# Patient Record
Sex: Female | Born: 1945 | Race: White | Hispanic: No | Marital: Married | Smoking: Never smoker
Health system: Southern US, Community
[De-identification: ages and names within clinical notes are randomized; demographics above are authoritative.]

## PROBLEM LIST (undated history)

## (undated) DIAGNOSIS — I10 Essential (primary) hypertension: Secondary | ICD-10-CM

## (undated) DIAGNOSIS — R519 Headache, unspecified: Secondary | ICD-10-CM

## (undated) DIAGNOSIS — I252 Old myocardial infarction: Secondary | ICD-10-CM

## (undated) DIAGNOSIS — R51 Headache: Secondary | ICD-10-CM

## (undated) DIAGNOSIS — E785 Hyperlipidemia, unspecified: Secondary | ICD-10-CM

## (undated) DIAGNOSIS — I2510 Atherosclerotic heart disease of native coronary artery without angina pectoris: Secondary | ICD-10-CM

## (undated) HISTORY — DX: Headache: R51

## (undated) HISTORY — DX: Essential (primary) hypertension: I10

## (undated) HISTORY — DX: Atherosclerotic heart disease of native coronary artery without angina pectoris: I25.10

## (undated) HISTORY — DX: Old myocardial infarction: I25.2

## (undated) HISTORY — PX: AUGMENTATION MAMMAPLASTY: SUR837

## (undated) HISTORY — DX: Hyperlipidemia, unspecified: E78.5

## (undated) HISTORY — DX: Headache, unspecified: R51.9

## (undated) SURGERY — Surgical Case
Anesthesia: *Unknown

---

## 1979-09-07 HISTORY — PX: ABDOMINAL HYSTERECTOMY: SHX81

## 1983-09-07 HISTORY — PX: BILATERAL SALPINGOOPHORECTOMY: SHX1223

## 1998-07-07 ENCOUNTER — Ambulatory Visit (HOSPITAL_COMMUNITY): Admission: RE | Admit: 1998-07-07 | Discharge: 1998-07-07 | Payer: Self-pay | Admitting: Obstetrics and Gynecology

## 1998-07-07 ENCOUNTER — Encounter: Payer: Self-pay | Admitting: Obstetrics and Gynecology

## 1998-12-25 ENCOUNTER — Ambulatory Visit (HOSPITAL_COMMUNITY): Admission: RE | Admit: 1998-12-25 | Discharge: 1998-12-25 | Payer: Self-pay | Admitting: Family Medicine

## 1998-12-25 ENCOUNTER — Encounter: Payer: Self-pay | Admitting: Family Medicine

## 1999-07-06 ENCOUNTER — Encounter: Payer: Self-pay | Admitting: Obstetrics and Gynecology

## 1999-07-06 ENCOUNTER — Encounter: Admission: RE | Admit: 1999-07-06 | Discharge: 1999-07-06 | Payer: Self-pay | Admitting: Obstetrics and Gynecology

## 1999-07-13 ENCOUNTER — Encounter: Payer: Self-pay | Admitting: Obstetrics and Gynecology

## 1999-07-13 ENCOUNTER — Ambulatory Visit (HOSPITAL_COMMUNITY): Admission: RE | Admit: 1999-07-13 | Discharge: 1999-07-13 | Payer: Self-pay | Admitting: Obstetrics and Gynecology

## 2000-05-18 ENCOUNTER — Other Ambulatory Visit: Admission: RE | Admit: 2000-05-18 | Discharge: 2000-05-18 | Payer: Self-pay | Admitting: Obstetrics and Gynecology

## 2000-07-14 ENCOUNTER — Ambulatory Visit (HOSPITAL_COMMUNITY): Admission: RE | Admit: 2000-07-14 | Discharge: 2000-07-14 | Payer: Self-pay | Admitting: Family Medicine

## 2000-07-14 ENCOUNTER — Encounter: Payer: Self-pay | Admitting: Family Medicine

## 2001-07-19 ENCOUNTER — Ambulatory Visit (HOSPITAL_COMMUNITY): Admission: RE | Admit: 2001-07-19 | Discharge: 2001-07-19 | Payer: Self-pay | Admitting: Unknown Physician Specialty

## 2001-07-19 ENCOUNTER — Encounter: Payer: Self-pay | Admitting: Family Medicine

## 2002-03-14 ENCOUNTER — Emergency Department (HOSPITAL_COMMUNITY): Admission: EM | Admit: 2002-03-14 | Discharge: 2002-03-14 | Payer: Self-pay | Admitting: Emergency Medicine

## 2002-03-14 ENCOUNTER — Encounter: Payer: Self-pay | Admitting: Emergency Medicine

## 2002-08-15 ENCOUNTER — Ambulatory Visit (HOSPITAL_COMMUNITY): Admission: RE | Admit: 2002-08-15 | Discharge: 2002-08-15 | Payer: Self-pay | Admitting: Family Medicine

## 2002-08-15 ENCOUNTER — Encounter: Payer: Self-pay | Admitting: Family Medicine

## 2002-10-16 ENCOUNTER — Other Ambulatory Visit: Admission: RE | Admit: 2002-10-16 | Discharge: 2002-10-16 | Payer: Self-pay | Admitting: Gynecology

## 2003-08-19 ENCOUNTER — Ambulatory Visit (HOSPITAL_COMMUNITY): Admission: RE | Admit: 2003-08-19 | Discharge: 2003-08-19 | Payer: Self-pay | Admitting: Family Medicine

## 2003-08-27 ENCOUNTER — Encounter: Admission: RE | Admit: 2003-08-27 | Discharge: 2003-08-27 | Payer: Self-pay | Admitting: Family Medicine

## 2003-10-14 ENCOUNTER — Other Ambulatory Visit: Admission: RE | Admit: 2003-10-14 | Discharge: 2003-10-14 | Payer: Self-pay | Admitting: Gynecology

## 2004-09-10 ENCOUNTER — Encounter: Admission: RE | Admit: 2004-09-10 | Discharge: 2004-09-10 | Payer: Self-pay | Admitting: Gynecology

## 2004-10-14 ENCOUNTER — Other Ambulatory Visit: Admission: RE | Admit: 2004-10-14 | Discharge: 2004-10-14 | Payer: Self-pay | Admitting: Gynecology

## 2005-04-13 ENCOUNTER — Other Ambulatory Visit: Admission: RE | Admit: 2005-04-13 | Discharge: 2005-04-13 | Payer: Self-pay | Admitting: Gynecology

## 2005-09-27 ENCOUNTER — Encounter: Admission: RE | Admit: 2005-09-27 | Discharge: 2005-09-27 | Payer: Self-pay | Admitting: Gynecology

## 2005-11-01 ENCOUNTER — Other Ambulatory Visit: Admission: RE | Admit: 2005-11-01 | Discharge: 2005-11-01 | Payer: Self-pay | Admitting: Gynecology

## 2006-05-16 ENCOUNTER — Other Ambulatory Visit: Admission: RE | Admit: 2006-05-16 | Discharge: 2006-05-16 | Payer: Self-pay | Admitting: Gynecology

## 2006-08-12 ENCOUNTER — Inpatient Hospital Stay (HOSPITAL_COMMUNITY): Admission: EM | Admit: 2006-08-12 | Discharge: 2006-08-14 | Payer: Self-pay | Admitting: Emergency Medicine

## 2006-08-12 HISTORY — PX: CARDIAC CATHETERIZATION: SHX172

## 2006-10-25 ENCOUNTER — Encounter: Admission: RE | Admit: 2006-10-25 | Discharge: 2006-10-25 | Payer: Self-pay | Admitting: Gynecology

## 2007-02-28 ENCOUNTER — Other Ambulatory Visit: Admission: RE | Admit: 2007-02-28 | Discharge: 2007-02-28 | Payer: Self-pay | Admitting: Gynecology

## 2007-12-19 ENCOUNTER — Inpatient Hospital Stay (HOSPITAL_COMMUNITY): Admission: EM | Admit: 2007-12-19 | Discharge: 2008-01-05 | Payer: Self-pay | Admitting: Emergency Medicine

## 2008-01-22 ENCOUNTER — Encounter: Admission: RE | Admit: 2008-01-22 | Discharge: 2008-01-22 | Payer: Self-pay | Admitting: Family Medicine

## 2008-01-22 ENCOUNTER — Encounter: Admission: RE | Admit: 2008-01-22 | Discharge: 2008-01-22 | Payer: Self-pay | Admitting: Gastroenterology

## 2008-02-07 ENCOUNTER — Encounter: Admission: RE | Admit: 2008-02-07 | Discharge: 2008-02-07 | Payer: Self-pay | Admitting: Gastroenterology

## 2008-03-26 ENCOUNTER — Encounter: Admission: RE | Admit: 2008-03-26 | Discharge: 2008-03-26 | Payer: Self-pay | Admitting: Family Medicine

## 2008-11-19 IMAGING — CT CT ABDOMEN W/ CM
2 of 5 series · 13 of 32 positions shown, 18 images · IV contrast (READICAT/WATER & [ID] OMNI 300)
Comparison: 01/01/2008

CT ABDOMEN

CLINICAL DATA: Pancreatitis

CT ABDOMEN AND PELVIS WITH CONTRAST
TECHNIQUE: Multidetector CT imaging of the abdomen and pelvis was
performed using the standard protocol following bolus
administration of intravenous contrast.
Contrast: 100 ml [id number]

[Series 2: abdomen w/ · axial · 0.70mm/px · z∈[-311,+14]mm · 6 of 91 slices shown, 11 images]
[im 13/91  soft-tissue]
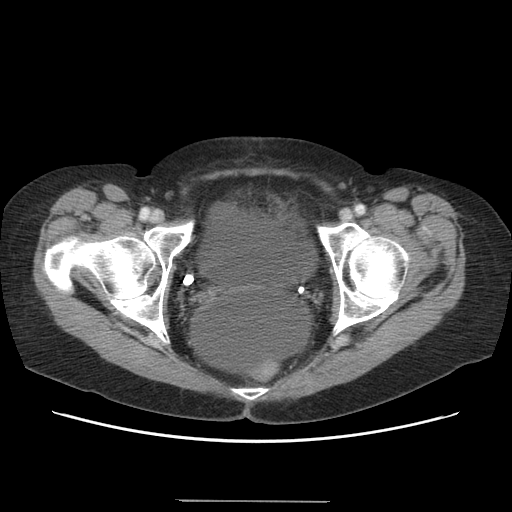
[im 13/91  bone]
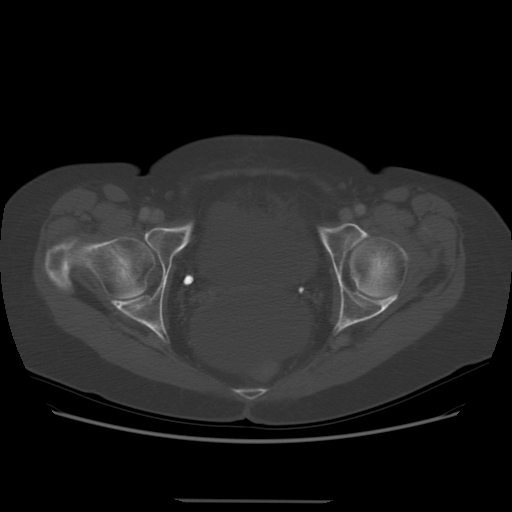
[im 26/91  soft-tissue]
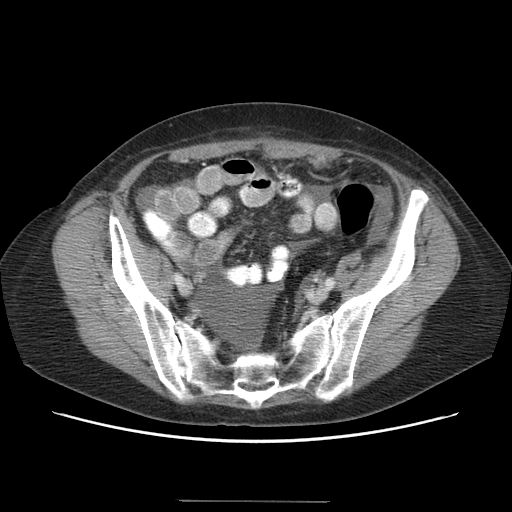
[im 39/91  soft-tissue]
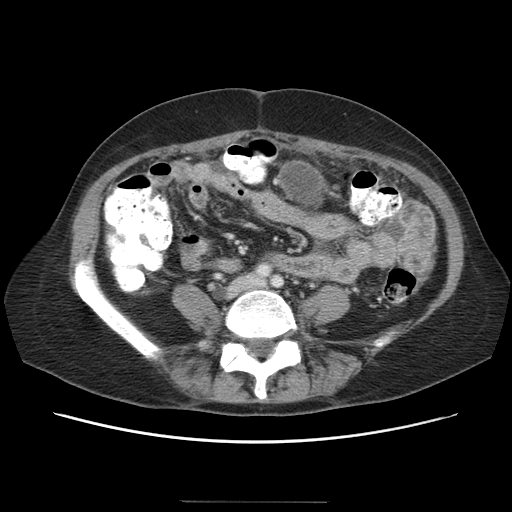
[im 39/91  lung]
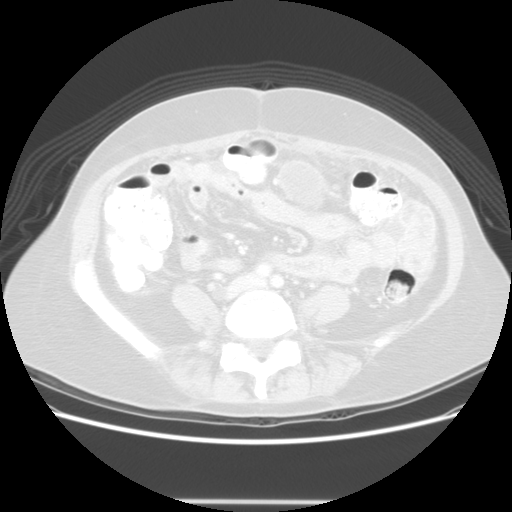
[im 52/91  soft-tissue]
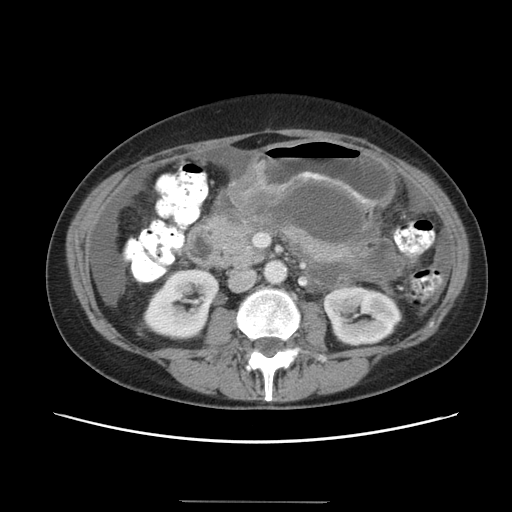
[im 52/91  lung]
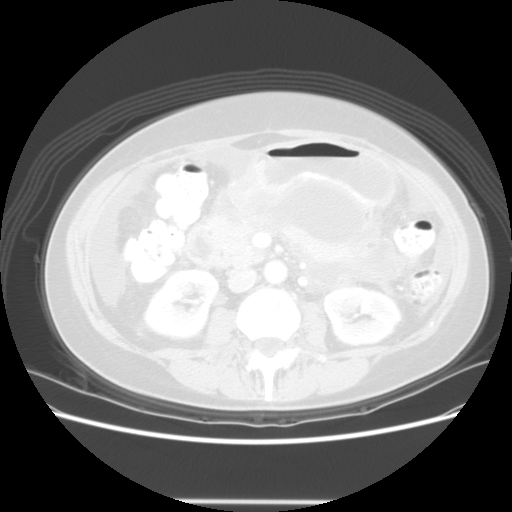
[im 65/91  soft-tissue]
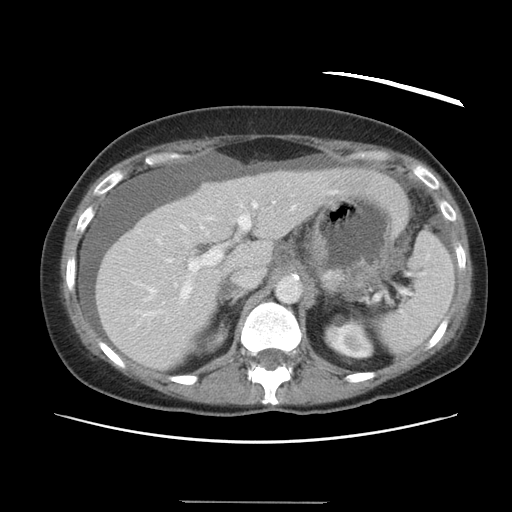
[im 65/91  lung]
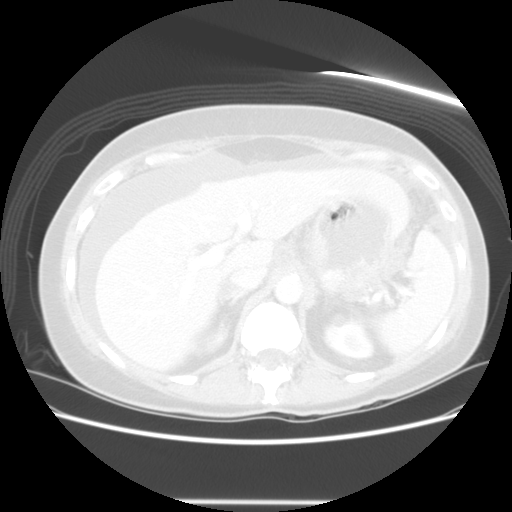
[im 78/91  soft-tissue]
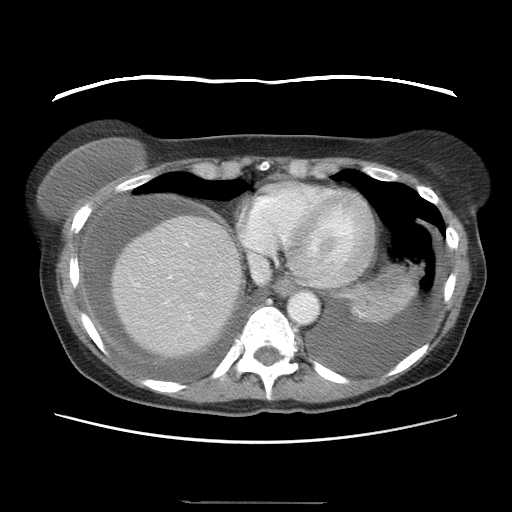
[im 78/91  lung]
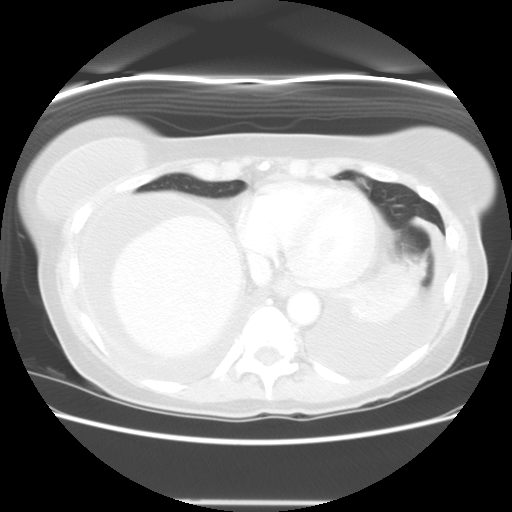

[Series 401: sagittal · sagittal · 0.94mm/px · 7 of 132 slices shown]
[im 14/132  soft-tissue]
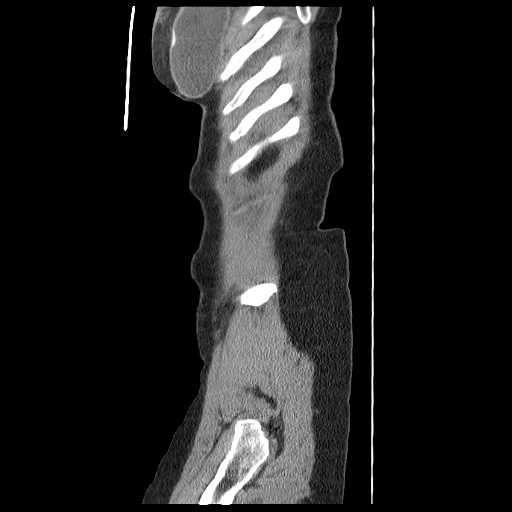
[im 27/132  soft-tissue]
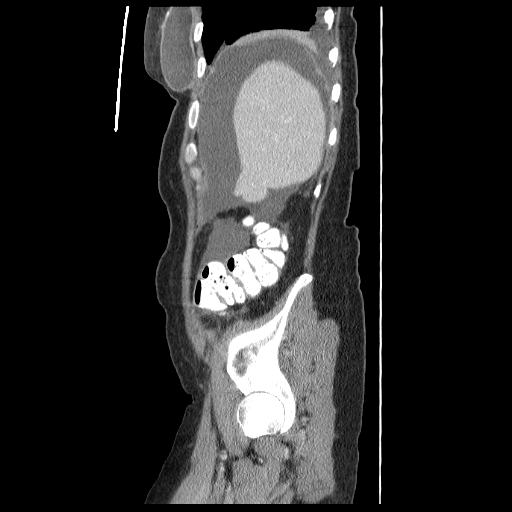
[im 40/132  soft-tissue]
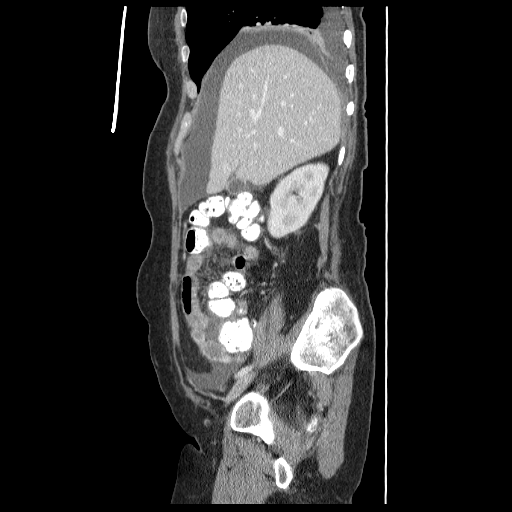
[im 53/132  soft-tissue]
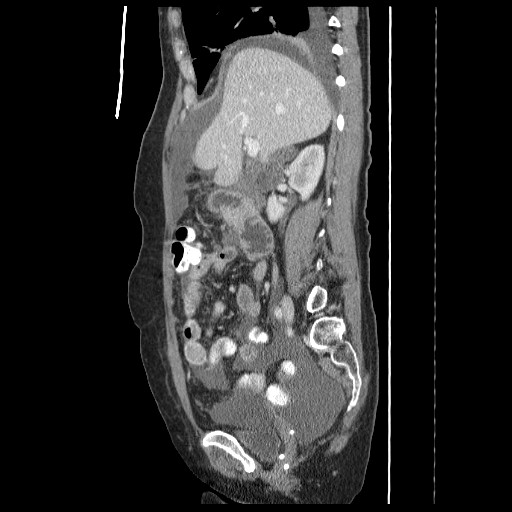
[im 79/132  soft-tissue]
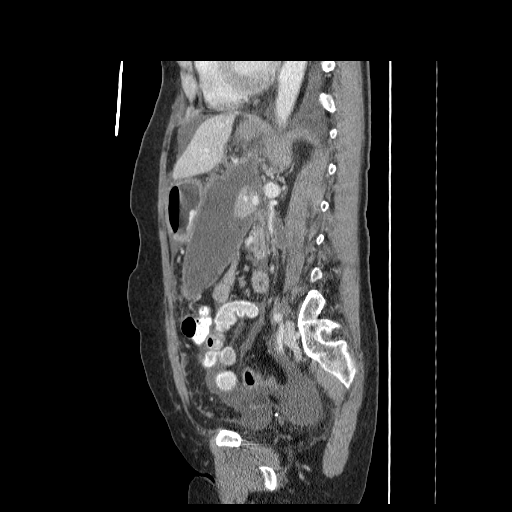
[im 92/132  soft-tissue]
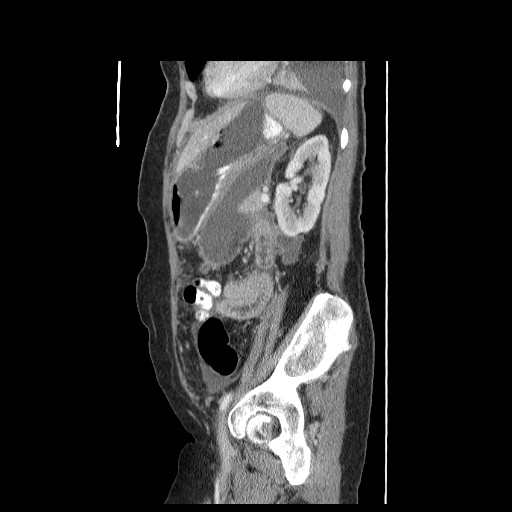
[im 105/132  soft-tissue]
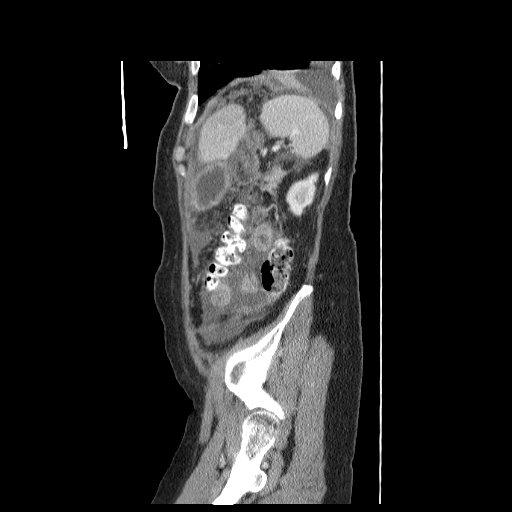

[13 of 32 positions shown; findings below may reference images not displayed]

FINDINGS: Lung bases show small bilateral pleural effusions.
There has been slight interval decrease in the amount of abdominal
free fluid although there is persistent perihepatic and perisplenic
ascites.  No focal abnormality is seen in the liver or spleen.  The
stomach is not distended.  Fluid within the lesser sac and
posterior to the distal stomach has become more organized in the
interval.  This collection measures 10.0 by 4.9 x 7.3 cm.  The
duodenum, gallbladder, adrenal glands, and kidneys are
unremarkable.

Numerous small fluid collections are scattered in the
retroperitoneal tissues, in the region of the hepatic duodenal
ligament and around the pancreatic tail.  Some of these demonstrate
rim enhancement suggesting interval evolution/Organization.  Most
of these collections are in the 2-4 cm size. The pancreas enhances
throughout.  The degree of pancreatic and retroperitoneal edema
appears decreased in the interval.  There is no pancreatic ductal
dilatation.

No evidence for bowel obstruction.  The portal vein, splenic vein,
and superior mesenteric vein are patent although there is mass
effect on the mid aspect of the splenic vein secondary to a
retroperitoneal fluid collection.
IMPRESSION: There has been minimal interval decrease in the free-flowing
intraperitoneal fluid.  A fluid collection in the lesser sac has
become more organized in the interval and suggests the presence of
an evolving pseudocyst. This generates mass effect on the posterior
stomach.

Multiple other 2-4 cm fluid collections in the retroperitoneal
tissues are becoming more organized and may represent scattered
small evolving pseudocysts.

No vascular complication at this time.

CT PELVIS
FINDINGS: There is persistent moderate volume intraperitoneal free
fluid and interloop mesenteric fluid.  No evidence for bowel
obstruction.  The uterus is surgically absent.  No adnexal mass is
identified.  The appendix and the terminal ileum are unremarkable.
IMPRESSION: The previous study did not include evaluation of the pelvis.
However when comparing to a study from 12/20/2007, the
intraperitoneal fluid in the pelvis has increased.

## 2009-01-14 ENCOUNTER — Encounter: Admission: RE | Admit: 2009-01-14 | Discharge: 2009-01-14 | Payer: Self-pay | Admitting: Family Medicine

## 2009-04-25 ENCOUNTER — Encounter: Admission: RE | Admit: 2009-04-25 | Discharge: 2009-04-25 | Payer: Self-pay | Admitting: Gynecology

## 2009-04-30 ENCOUNTER — Encounter: Admission: RE | Admit: 2009-04-30 | Discharge: 2009-04-30 | Payer: Self-pay | Admitting: Gynecology

## 2010-05-26 ENCOUNTER — Encounter: Admission: RE | Admit: 2010-05-26 | Discharge: 2010-05-26 | Payer: Self-pay | Admitting: Gynecology

## 2010-09-27 ENCOUNTER — Encounter: Payer: Self-pay | Admitting: Gynecology

## 2010-09-28 ENCOUNTER — Encounter: Payer: Self-pay | Admitting: Gynecology

## 2011-01-19 NOTE — H&P (Signed)
NAMETIAWANA, FORGY                 ACCOUNT NO.:  [id number]   MEDICAL RECORD NO.:  [id number]          PATIENT TYPE:  INP   LOCATION:  1825                         FACILITY:  [facility]   PHYSICIAN:  Michiel Cowboy, MDDATE OF BIRTH:  1945-12-07   DATE OF ADMISSION:  12/19/2007  DATE OF DISCHARGE:                              HISTORY & PHYSICAL   PRIMARY CARE Ivery Michalski:  Holley Bouche, M.D.   The patient is a 65 year old female with a past medical history of  coronary artery disease, hypertension and hyperlipidemia.  She presented  to her primary care Abegail Kloeppel because of severe epigastric pain this a.m.  The patient went this morning to exercise in the gym, and then ate  lunch.  Shortly thereafter the patient developed severe nausea,  vomiting, diarrhea and severe epigastric pain.  At first the patient  presented to her primary care Buell Parcel, who felt that she was too sick  and sent her down to the emergency department by EMS.  In the ER a right  upper quadrant ultrasound was done, that was suggestive of pancreatitis.  Her lipase was drawn and was above 2000.  [facility] called to  admit the patient.   ALLERGIES:  PLAVIX.   MEDICATIONS:  1. Aspirin 81 mg p.o. daily.  2. Celexa 20 mg p.o. daily.  3. Hydrochlorothiazide 6.25 mg p.o. daily.  4. Lipitor 20 mg p.o. daily.  5. Toprol XL 50 mg p.o. daily.   PAST MEDICAL HISTORY:  1. Myocardial infarction.  2. Hypertension.  3. Hyperlipidemia.   SOCIAL HISTORY:  The patient does not drink absolutely.  She has not had  any alcohol for a very long period of time, cannot recall her last  drink.  Denies any smoking.  Has a very healthy lifestyle, this is as  per her husband.   FAMILY HISTORY:  Noncontributory.  No liver or pancreatic disease.   VITALS:  Temperature 97.0, blood pressure 163/90, pulse 67, respirations  20, saturations 93% on room air.   PHYSICAL EXAMINATION:  The patient is a white female clearly in pain,  sitting on a stretcher hugging her abdomen.  Somewhat dry mucous  membranes.  Decreased skin turgor.  HEENT:  Head nontraumatic.  LUNGS:  Clear to auscultation bilaterally.  HEART:  Regular rate and rhythm; no murmurs, rubs or gallops.  ABDOMEN:  Severe epigastric tenderness in the right upper quadrant and  left upper quadrant tenderness.  Guarding noted.  EXTREMITIES:  Lower extremities without edema.  NEUROLOGIC:  Intact.   LABS:  White blood cell count 14.8, hemoglobin 14.1, platelets 227.  Sodium 137, potassium 3.3, BUN 17, creatinine 1.1.  Total bilirubin 1.8,  alkaline phosphatase 166.  AST 447, ALT 244.  Calcium 8.3.  Lipase above  2000.   EKG:  Showing normal sinus at rate of 64.  No evidence of ischemia or  infarction.   STUDIES:  CHEST X-RAY:  No acute chest disease noted.  RIGHT UPPER  QUADRANT ULTRASOUND:  Showed mild fullness in heterogenicity of  pancreas; that raised a question of pancreatitis.  Small amount of  abdominal  ascites.  No evidence of cholelithiasis or biliary ductal  dilatation.  Gallbladder wall was minimally thickened, which can be seen  in setting of ascites.  Cortical thinning of both kidneys, suggests  medical renal disease.   ASSESSMENT AND PLAN:  This is a 65 year old female with acute  pancreatitis.  Etiology unclear.  1. Pancreatitis:  We will admit to stepdown, given the severity.  Will      make the patient n.p.o.  IV hydration.  The patient already      received Zosyn in the ED.  At this point, no evidence of      necrotizing pancreatitis.  Will hold off on antibiotics for now.      Will attempt to get hold of GI for further recommendation.  The      patient may not need either MRI or CT; will go directly to ERCP,      pending if further evidence of stone obstruction.  Currently, the      ultrasound did not show any dilatation of the biliary duct.  Other      etiologies for pancreatitis; the patient denies alcohol.  Will      check  triglyceride level.  Will check hepatitis serologies;      although much less likely.  Will also check heme ABV serologies,      although again very much less likely.  Will hold      hydrochlorothiazide.  2. Coronary artery disease:  Continue aspirin and restart the Toprol      tomorrow if holding parameters.  3. History of depression:  Continue Celexa.  4. Pain care management:  Will do Dilaudid PCA.  Do Colace and Senokot      for prophylaxis of likely constipation.  Also, given the degree of      abdominal pain, will obtain just plain film x-ray to rule out the      possibility of free air; as any other etiology of severe abdominal      pain.  5. Hypopotassemia, will replace.  6. Prophylaxis:  SCDs and Protonix.      Michiel Cowboy, MD  Electronically Signed     AVD/MEDQ  D:  12/20/2007  T:  12/20/2007  Job:  [id number]   cc:   Holley Bouche, M.D.

## 2011-01-19 NOTE — Discharge Summary (Signed)
NAMEJEHIELI, BRASSELL                 ACCOUNT NO.:  [id number]   MEDICAL RECORD NO.:  [id number]          PATIENT TYPE:  INP   LOCATION:  5157                         FACILITY:  [facility]   PHYSICIAN:  Kela Millin, M.D.DATE OF BIRTH:  1946/01/30   DATE OF ADMISSION:  12/19/2007  DATE OF DISCHARGE:  01/02/2008                               DISCHARGE SUMMARY   DISCHARGE DIAGNOSES:  1. Acute pancreatitis, possibly medication induced.  The patient was      on hydrochlorothiazide and also lisinopril which are both possible      causes of pancreatitis.  Workup negative for gallstones, and the      patient denied alcohol abuse.  GI (Dr. Ewing Schlein) recommended      outpatient follow-up once she is over this episode for      cholecystectomy and doing intraoperative cholangiogram (even though      studies as above negative for gallstones), due to the degree of      elevated LFTs and lipase upon admission.  2. Possible hospital-acquired pneumonia, right upper lobe.  3. Peripheral edema/pleural effusions.  Secondary to hypoalbuminemia.      Improved with diuresis.  4. Hypokalemia.  Potassium replaced.  5. Hypertension.  6. Abnormal LFTs.  Improved, as discussed above.  7. History of coronary artery disease.  Stable.  8. Hyperlipidemia.  9. Normocytic anemia.   PROCEDURES AND STUDIES:  1. Abdominal ultrasound.  Mild fullness and heterogeneity of the      pancreas racing possibility of pancreatitis per radiology.  Small      amount of abdominal ascites likely secondary to pancreatitis.  No      evidence of cholelithiasis or biliary ductal dilatation.      Gallbladder wall minimally thickened which can be seen in the      setting of ascites.  Cortical thinning of both kidneys suggest      medical renal disease.  2. CT scan of abdomen and pelvis on December 20, 2007.  Findings      consistent with acute pancreatitis with abdominal ascites with no      evidence of pancreatic necrosis or pseudocyst  formation at this      time.  Flat inferior vena cava suggest hypovolemia.  3. MRCP on December 26, 2007.  Stable changes of acute pancreatitis with      retroperitoneal edema, ascites and flank edema.  No evidence of      pancreatic necrosis or pseudocyst formation.  No biliary dilatation      or evidence of choledocholithiasis.  4. CT scan of he abdomen on January 01, 2008.  Mild improvement in no      acute pancreatitis involving the pancreatic head uncinate process.      Increased generalized peripancreatic and intraperitoneal fluid.  No      mature pseudocyst identified.  New moderate bilateral pleural      effusions and basilar atelectasis.  5. Chest x-ray on December 27, 2007.  Bilateral pleural effusions, also      bibasilar atelectatic/infiltrate changes and mild patchy      atelectasis/infiltrates  within the right upper lobe.   CONSULTATIONS:  Gastroenterology, Deboraha Sprang.   BRIEF HISTORY:  The patient is a pleasant 65 year old white female with  the above-listed medical problems who presented with complaints of  severe epigastric pain on the morning of admission.  She reported that  she had gone to the gym to exercise that morning and then ate lunch, and  shortly thereafter she developed severe nausea, vomiting and diarrhea  with severe epigastric pain.  She was sent to the ER for further  evaluation and a right upper quadrant ultrasound was done which was  suggestive of pancreatitis.  Her lipase was greater than 2000, and she  was admitted for further evaluation and management.   Please see the full admission history and physical dictated by Dr.  Adela Glimpse for the details of the admission physical exam as well as the  laboratory data.   HOSPITAL COURSE:  1. Acute pancreatitis, severe.  Upon admission, the patient was kept      n.p.o. and started on IV fluids for hydration as well as IV      narcotics for pain management.  Workup for possible etiologies      included the abdominal  ultrasound, MRCPs and CT scans evaluating      for possible gallstones and these were negative for gallstones.      Triglyceride level was checked and on admission and it was 53 and      the patient denied alcohol abuse.  It was noted that she was on      hydrochlorothiazide which is a known cause of pancreatitis and      lisinopril which can also possibly cause pancreatitis, and these      were both held during her hospital stay.  Gastroenterology was      consulted and followed and assisted in the patient's management      throughout her stay in the hospital.  It was noted that her LFTs      were elevated on admission, total bilirubin of 1.8, alkaline      phosphatase of 166, SGOT 447, SGPT of 244.  This improved while she      was in the hospital to a total bilirubin of 1.2, alkaline      phosphatase of 191, SGOT of 72, SGPT of 48, albumin still low at      1.5 as of the last recheck on December 29, 2007, and GI indicated that      because of this marked LFT elevation on admission as well as the      marked lipase elevations that once the patient is over this episode      to follow up outpatient for cholecystectomy with intraoperative      cholangiogram, even though no gallstones were seen in the above      studies as already discussed.  The patient was slow to improve as      even after her abdominal pain improved.  She had difficulty      tolerating adequate p.o. intake, and so she was subsequently      started on tube feedings at the end of the period that this      dictation stands, and this was after she had had repeat imaging      studies that showed that there was mild improvement in the      pancreatitis.  Follow-up lipase on April 22 was 26 and the amylase      was  at 90.  The patient was hemodynamically stable and afebrile.  2. Probable hospital-acquired pneumonia, right upper lobe.  The      patient had progressive shortness of breath and her white cell      count was worsening  even though repeat imaging studies had shown      there was mild improvement in the pancreatitis, a chest x-ray was      done for further evaluation and compared to the previous chest x-      ray it showed that there was mild increase in bilateral pleural      effusion as well as bibasilar atelectatic/infiltrative changes with      a small patchy area of airspace disease noted within the right      upper lobe.  The patient was then started on this Zosyn for      possible hospital-acquired pneumonia and was also diuresed with IV      Lasix.  With this intervention, her shortness of breath improved      and also she has remained afebrile with a gradual improvement in      leukocytosis.  3. Pleural effusions/peripheral edema.  The impression was that this      was secondary to hypoalbuminemia (her last albumin was 1.5), and      the patient had been requiring IV fluids for hydration.  As soon as      she was able to tolerate some p.o.'s, the IV fluids were decreased      and she was diuresed as appropriate.  Once she was started on tube      feedings, the IV fluids were discontinued.  The edema as well as      the pleural effusions and shortness of breath have improved with      diuresis.  4. Normocytic anemia, likely due to chronic disease.  The patient was      transfused per GI, and her hemoglobin improved from 8.1-10.5 and      has remained stable.  She did not have any evidence of GI bleed      during her hospital stay.  5. Abnormal LFTs.  As discussed above, she is to follow up outpatient.  6. Hypokalemia.  Potassium was replaced while in the hospital.  7. Coronary artery disease.  Stable, she was maintained on her Toprol      during her hospital stay.  8. Hypertension.  As discussed above, her lisinopril and HCTZ were      held during her hospital stay, and appropriate medications to be      indicated upon discharge per discharging physician.     A discharge addendum is to be  done by the discharging physician  including the discharge medications and follow-up care per the  discharging hospitalist.      Kela Millin, M.D.  Electronically Signed     ACV/MEDQ  D:  01/05/2008  T:  01/05/2008  Job:  [id number]   cc:   Holley Bouche, M.D.  James L. Malon Kindle., M.D.

## 2011-01-22 NOTE — Discharge Summary (Signed)
[name], [name]                 ACCOUNT NO.:  [id number]   MEDICAL RECORD NO.:  [id number]          PATIENT TYPE:  INP   LOCATION:  2009                         FACILITY:  [facility]   PHYSICIAN:  Vesta Mixer, M.D. DATE OF BIRTH:  Feb 02, 1946   DATE OF ADMISSION:  08/12/2006  DATE OF DISCHARGE:  08/14/2006                               DISCHARGE SUMMARY   DISCHARGE DIAGNOSES:  1. Acute inferoapical myocardial infarction.  2. Hyperlipidemia.  3. Hypertension.   DISCHARGE MEDICATIONS:  1. Enteric-coated aspirin 81 mg a day.  2. Plavix 75 mg a day.  3. Lipitor 20 mg a day.  4. Toprol XL 50 mg a day.  5. Lisinopril 10 mg tablets - one half tablet twice a day.  6. Nitroglycerin 0.4 mg sublingually as needed.  7. Celexa 20 mg a day.   DISPOSITION:  The patient will see Dr. Elease Hashimoto in the office in 1-2  weeks.  She is to call for questions.   HISTORY:  [name] is a 65 year old female with a history of  hypertension and hypercholesterolemia.  She was admitted with acute  headache and chest pain.  Please see dictated H&P for further details.   HOSPITAL COURSE BY PROBLEM:  CHEST PAIN.  The patient developed severe  headache and severe chest pain on the afternoon of admission.  She  presented to the ER and was found to be quite hypertensive.  Her blood  pressure came down nicely.  She was found to have ST-segment elevation  in the inferolateral leads.  By the time she arrived in the ER she was  not having any further episodes of chest pain.  Her troponin levels were  found to be elevated and the second troponin level was higher still.  We  took her to the cath lab and she was found to have a subtotal occlusion  of the distal aspect of her posterior descending artery.  The posterior  descending artery improved during the duration of the heart  catheterization most likely because of the Integrilin and heparin.  By  the end of the case, she did have some flow down the posterior  descending artery.  This vessel is too small to consider angioplasty or  stenting.   The left ventriculogram revealed overall well-preserved left ventricular  systolic function but she did have an inferoapical wall motion  abnormality consistent with her infarction.   She is started on lisinopril and her Toprol was continued.  She had  Integrilin for 18 hours and gradually improved.  She did not have any  further episodes of chest discomfort.   She is discharged on the above-noted medications and disposition.  Our  hope is that this inferoapical wall motion defect will improve over time  with adequate medical therapy.   In the office, we will make sure that her cholesterol levels are  adequate with an LDL between 70 and 80.  We will continue with the ACE  inhibitor.   She is to call for any problems or questions.           ______________________________  Vesta Mixer, M.D.     PJN/MEDQ  D:  08/14/2006  T:  08/14/2006  Job:  [id number]   cc:   Holley Bouche, M.D.

## 2011-01-22 NOTE — Discharge Summary (Signed)
NAMETOLULOPE, PINKETT                 ACCOUNT NO.:  [id number]   MEDICAL RECORD NO.:  [id number]          PATIENT TYPE:  INP   LOCATION:  5157                         FACILITY:  [facility]   PHYSICIAN:  Corinna L. Lendell Caprice, MDDATE OF BIRTH:  November 09, 1945   DATE OF ADMISSION:  12/19/2007  DATE OF DISCHARGE:  01/05/2008                               DISCHARGE SUMMARY   ADDENDUM  The patient's diet was able to be advanced.  Her tube feeds were able to  be stopped.  Antibiotics were stopped.  She was instructed to stop  hydrochlorothiazide, Lipitor, lisinopril and increase her Toprol-XL to  100 mg a day.  She could resume aspirin 81 mg a day, Celexa 20 mg a day,  and calcium with vitamin D daily, albuterol twice a day  2 puffs q.4 h.  p.r.n. for wheeze.  Condition stable.  Follow up with Dr. Carman Ching  on Jan 19, 2008, at 9:45.  Follow up with Dr. Tiburcio Pea on Jan 11, 2008, at  11:30.  Please monitor blood pressure.  Activity ad lib.  Condition  stable.  Diet should be low-fat.      Corinna L. Lendell Caprice, MD  Electronically Signed     CLS/MEDQ  D:  03/06/2008  T:  03/07/2008  Job:  [id number]   cc:   Fayrene Fearing L. Malon Kindle., M.D.  Holley Bouche, M.D.

## 2011-01-22 NOTE — Cardiovascular Report (Signed)
[name], [name] NO.:  [id number]   MEDICAL RECORD NO.:  [id number]          PATIENT TYPE:  INP   LOCATION:  2807                         FACILITY:  [facility]   PHYSICIAN:  Vesta Mixer, M.D. DATE OF BIRTH:  [birth year]   DATE OF PROCEDURE:  08/12/2006  DATE OF DISCHARGE:                            CARDIAC CATHETERIZATION   [name] is a 65 year old female with a history of hypertension and  hypercholesterolemia.  She was brought to the cath lab emergently after  having some episodes of chest discomfort and an abnormal troponin level  as well as an abnormal EKG.   PROCEDURE:  Left heart catheterization with coronary angiography.   The right femoral artery was easily cannulated using a modified  Seldinger technique.   HEMODYNAMIC RESULTS:  LV pressure is 117/90 with an aortic pressure of  115/65.   ANGIOGRAPHY:  The left main is smooth and normal.   The left anterior descending artery is smooth and normal.  It gives off  two large diagonal branches both of which are normal.   The left circumflex artery is a fairly large vessel.  It gives off two  moderate sized obtuse marginal branches, both of which are normal.  The  circumflex artery terminates as a posterolateral branch which is also  normal.   The right coronary artery is large and dominant.  It is smooth and  normal throughout its course.  It gives off a moderate size posterior  descending artery.  The distal aspect of the posterior descending artery  is subtotally occluded with TIMI grade 1 flow.  While we were doing the  injections, the flow seemed to be gradually improving.   The posterior descending artery is unremarkable.   The left ventriculogram was performed in the 30 RAO position.  It  revealed overall normal left ventricular systolic function.  There was  akinesis of the inferior apical wall consistent with the subtotal  occlusion of the posterior descending artery.   COMPLICATIONS:  None.   CONCLUSION:  1. Single-vessel coronary artery disease involving the distal aspect      of the posterior descending artery.  This branch is very tiny and      is not a candidate for percutaneous intervention.  In fact, it      seems to be getting better as      the patient is on heparin and Integrilin.  We will continue with      medical therapy and we will treat her with Plavix as an outpatient.      She does have a segmental wall motion abnormality but I would      expect some of this to improve with medical therapy.  All our other      coronary arteries are unremarkable.           ______________________________  Vesta Mixer, M.D.     PJN/MEDQ  D:  08/12/2006  T:  08/13/2006  Job:  [id number]   cc:   Holley Bouche, M.D.  Armanda Magic, M.D.

## 2011-01-22 NOTE — H&P (Signed)
[name], [name] NO.:  [id number]   MEDICAL RECORD NO.:  [id number]          PATIENT TYPE:  EMS   LOCATION:  [facility]                         FACILITY:  [facility]   PHYSICIAN:  Vesta Mixer, M.D. DATE OF BIRTH:  [birth year]   DATE OF ADMISSION:  08/12/2006  DATE OF DISCHARGE:                              HISTORY & PHYSICAL   HISTORY OF PRESENT ILLNESS:  [name] is a 65 year old female with a  history of hypercholesterolemia and hypertension.  She is admitted with  acute chest pain, as well as a headache.   The patient has been in her normal state of health.  This afternoon she  developed a severe headache and went to see her medical doctor.  She was  noted to have some hypertension.  I am not sure of her blood pressure at  that time.  She was told to come to the hospital.  Along the way she  developed the worst headache of her life.  She also developed some mild  chest discomfort.  She pulled off the road and let her husband drive her  the rest of the way to the hospital.  Here in the emergency room she had  an emergent CT scan of her head which was negative for an acute bleed.  She mentioned some mild chest pressure and a subsequent EKG revealed ST-  segment elevation in the inferolateral leads.  Her subsequent troponin  levels were elevated and she is now being taken to the cath lab for  emergent evaluation.   The patient is completely pain-free at this time.  She denies any  headache or chest discomfort.  She denies any real shortness of breath.  She has never had any previous episodes of chest discomfort.  She is  relatively healthy and is relatively active.   CURRENT MEDICATIONS:  Lipitor 20 mg a day, Toprol XL 75 mg a day and  Celexa 20 mg a day.   ALLERGIES:  NONE.   PAST MEDICAL HISTORY:  1. Hypercholesterolemia  2. Hypertension.   SOCIAL HISTORY:  The patient is a nonsmoker.  She works as a Futures trader.   FAMILY HISTORY:  Unremarkable.   PHYSICAL EXAMINATION:  GENERAL:  On exam she is a middle-aged female in  no acute distress.  She is alert and oriented times three.  Mood and  affect are normal.  VITAL SIGNS:  Blood pressure is 157/95 with a heart rate of 64.  HEENT:  Exam reveals 2+ carotids.  She has no bruits, no JVD, no  thyromegaly.  LUNGS:  Clear.  HEART:  Regular rate, S1-S2.  I did not hear any pericardial friction  rub.  ABDOMEN:  Exam is benign.  EXTREMITIES:  She has no clubbing, cyanosis or edema.  Pulses are  intact.  There is no calf tenderness.  There are no palpable cords.   DATA REVIEWED:  Her EKG reveals normal sinus rhythm/sinus bradycardia.  She has persistent ST-segment elevation in the inferolateral leads.  She  had this ST-segment elevation while she was having some mild chest  tightness, and  she has the persistent ST-segment elevation even while  pain-free.   Troponin levels initially were 0.31 and now are 0.6.   ASSESSMENT AND PLAN:  [name] presents with an acute episode of  severe headache accompanied by some chest pressure.  She has an EKG  consistent with ST-segment elevation myocardial infarction and troponin  levels that are slowly rising.  We will take her urgently to the cath  lab for further evaluation.  We discussed the risks, benefits and  options of heart catheterization.  She understands and agrees to  proceed.  All of her other medical problems remain stable.           ______________________________  Vesta Mixer, M.D.     PJN/MEDQ  D:  08/12/2006  T:  08/13/2006  Job:  [id number]   cc:   Holley Bouche, M.D.

## 2011-02-22 ENCOUNTER — Other Ambulatory Visit: Payer: Self-pay | Admitting: Family Medicine

## 2011-02-22 ENCOUNTER — Ambulatory Visit
Admission: RE | Admit: 2011-02-22 | Discharge: 2011-02-22 | Disposition: A | Discharge status: Home / Self Care | Payer: Medicare Other | Source: Ambulatory Visit | Attending: Family Medicine | Admitting: Family Medicine

## 2011-05-20 ENCOUNTER — Other Ambulatory Visit: Payer: Self-pay | Admitting: Gynecology

## 2011-05-20 DIAGNOSIS — Z1231 Encounter for screening mammogram for malignant neoplasm of breast: Secondary | ICD-10-CM

## 2011-06-01 LAB — BASIC METABOLIC PANEL
BUN: 10
BUN: 18
BUN: 25 — ABNORMAL HIGH
BUN: 3 — ABNORMAL LOW
BUN: 7
BUN: 7
BUN: 8
BUN: 9
CO2: 16 — ABNORMAL LOW
CO2: 19
CO2: 22
CO2: 22
CO2: 23
CO2: 27
CO2: 29
CO2: 33 — ABNORMAL HIGH
Calcium: 5 — CL
Calcium: 5.5 — CL
Calcium: 6.6 — ABNORMAL LOW
Calcium: 6.7 — ABNORMAL LOW
Calcium: 7 — ABNORMAL LOW
Calcium: 7 — ABNORMAL LOW
Calcium: 7.1 — ABNORMAL LOW
Calcium: 7.5 — ABNORMAL LOW
Chloride: 104
Chloride: 105
Chloride: 105
Chloride: 109
Chloride: 114 — ABNORMAL HIGH
Chloride: 91 — ABNORMAL LOW
Chloride: 98
Chloride: 98
Creatinine, Ser: 0.88
Creatinine, Ser: 0.89
Creatinine, Ser: 0.96
Creatinine, Ser: 1.04
Creatinine, Ser: 1.08
Creatinine, Ser: 1.14
Creatinine, Ser: 1.24 — ABNORMAL HIGH
Creatinine, Ser: 1.44 — ABNORMAL HIGH
GFR calc Af Amer: 44 — ABNORMAL LOW
GFR calc Af Amer: 53 — ABNORMAL LOW
GFR calc Af Amer: 58 — ABNORMAL LOW
GFR calc Af Amer: >60
GFR calc Af Amer: >60
GFR calc Af Amer: >60
GFR calc Af Amer: >60
GFR calc Af Amer: >60
GFR calc non Af Amer: 37 — ABNORMAL LOW
GFR calc non Af Amer: 44 — ABNORMAL LOW
GFR calc non Af Amer: 48 — ABNORMAL LOW
GFR calc non Af Amer: 51 — ABNORMAL LOW
GFR calc non Af Amer: 54 — ABNORMAL LOW
GFR calc non Af Amer: 59 — ABNORMAL LOW
GFR calc non Af Amer: >60
GFR calc non Af Amer: >60
Glucose, Bld: 104 — ABNORMAL HIGH
Glucose, Bld: 107 — ABNORMAL HIGH
Glucose, Bld: 117 — ABNORMAL HIGH
Glucose, Bld: 122 — ABNORMAL HIGH
Glucose, Bld: 129 — ABNORMAL HIGH
Glucose, Bld: 156 — ABNORMAL HIGH
Glucose, Bld: 183 — ABNORMAL HIGH
Glucose, Bld: 85
Potassium: 2.8 — ABNORMAL LOW
Potassium: 3.1 — ABNORMAL LOW
Potassium: 3.5
Potassium: 3.8
Potassium: 3.9
Potassium: 4
Potassium: 4.1
Potassium: 5.9 — ABNORMAL HIGH
Sodium: 131 — ABNORMAL LOW
Sodium: 132 — ABNORMAL LOW
Sodium: 133 — ABNORMAL LOW
Sodium: 134 — ABNORMAL LOW
Sodium: 134 — ABNORMAL LOW
Sodium: 135
Sodium: 135
Sodium: 138

## 2011-06-01 LAB — COMPREHENSIVE METABOLIC PANEL
ALT: 244 — ABNORMAL HIGH
ALT: 102 — ABNORMAL HIGH
ALT: 266 — ABNORMAL HIGH
ALT: 34
ALT: 38 — ABNORMAL HIGH
ALT: 40 — ABNORMAL HIGH
ALT: 41 — ABNORMAL HIGH
ALT: 41 — ABNORMAL HIGH
ALT: 43 — ABNORMAL HIGH
ALT: 45 — ABNORMAL HIGH
ALT: 47 — ABNORMAL HIGH
ALT: 48 — ABNORMAL HIGH
AST: 447 — ABNORMAL HIGH
AST: 105 — ABNORMAL HIGH
AST: 314 — ABNORMAL HIGH
AST: 42 — ABNORMAL HIGH
AST: 46 — ABNORMAL HIGH
AST: 48 — ABNORMAL HIGH
AST: 49 — ABNORMAL HIGH
AST: 50 — ABNORMAL HIGH
AST: 54 — ABNORMAL HIGH
AST: 59 — ABNORMAL HIGH
AST: 62 — ABNORMAL HIGH
AST: 72 — ABNORMAL HIGH
Albumin: 3.7
Albumin: 1.5 — ABNORMAL LOW
Albumin: 1.5 — ABNORMAL LOW
Albumin: 1.5 — ABNORMAL LOW
Albumin: 1.5 — ABNORMAL LOW
Albumin: 1.6 — ABNORMAL LOW
Albumin: 1.6 — ABNORMAL LOW
Albumin: 1.7 — ABNORMAL LOW
Albumin: 1.8 — ABNORMAL LOW
Albumin: 1.9 — ABNORMAL LOW
Albumin: 2.1 — ABNORMAL LOW
Albumin: 3.4 — ABNORMAL LOW
Alkaline Phosphatase: 166 — ABNORMAL HIGH
Alkaline Phosphatase: 105
Alkaline Phosphatase: 128 — ABNORMAL HIGH
Alkaline Phosphatase: 153 — ABNORMAL HIGH
Alkaline Phosphatase: 161 — ABNORMAL HIGH
Alkaline Phosphatase: 176 — ABNORMAL HIGH
Alkaline Phosphatase: 191 — ABNORMAL HIGH
Alkaline Phosphatase: 57
Alkaline Phosphatase: 61
Alkaline Phosphatase: 78
Alkaline Phosphatase: 80
Alkaline Phosphatase: 85
BUN: 17
BUN: 12
BUN: 17
BUN: 20
BUN: 23
BUN: 23
BUN: 4 — ABNORMAL LOW
BUN: 6
BUN: 8
BUN: 8
BUN: 8
BUN: 9
CO2: 23
CO2: 14 — ABNORMAL LOW
CO2: 18 — ABNORMAL LOW
CO2: 18 — ABNORMAL LOW
CO2: 20
CO2: 21
CO2: 22
CO2: 23
CO2: 24
CO2: 31
CO2: 32
CO2: 33 — ABNORMAL HIGH
Calcium: 8.3 — ABNORMAL LOW
Calcium: 5 — CL
Calcium: 5.4 — CL
Calcium: 6.7 — ABNORMAL LOW
Calcium: 6.7 — ABNORMAL LOW
Calcium: 7 — ABNORMAL LOW
Calcium: 7.1 — ABNORMAL LOW
Calcium: 7.1 — ABNORMAL LOW
Calcium: 7.2 — ABNORMAL LOW
Calcium: 7.2 — ABNORMAL LOW
Calcium: 7.2 — ABNORMAL LOW
Calcium: 7.3 — ABNORMAL LOW
Chloride: 100
Chloride: 102
Chloride: 106
Chloride: 107
Chloride: 107
Chloride: 107
Chloride: 109
Chloride: 113 — ABNORMAL HIGH
Chloride: 116 — ABNORMAL HIGH
Chloride: 89 — ABNORMAL LOW
Chloride: 92 — ABNORMAL LOW
Chloride: 94 — ABNORMAL LOW
Creatinine, Ser: 1.1
Creatinine, Ser: 0.88
Creatinine, Ser: 1.01
Creatinine, Ser: 1.03
Creatinine, Ser: 1.03
Creatinine, Ser: 1.1
Creatinine, Ser: 1.2
Creatinine, Ser: 1.22 — ABNORMAL HIGH
Creatinine, Ser: 1.25 — ABNORMAL HIGH
Creatinine, Ser: 1.29 — ABNORMAL HIGH
Creatinine, Ser: 1.29 — ABNORMAL HIGH
Creatinine, Ser: 1.36 — ABNORMAL HIGH
GFR calc Af Amer: >60
GFR calc Af Amer: 48 — ABNORMAL LOW
GFR calc Af Amer: 51 — ABNORMAL LOW
GFR calc Af Amer: 51 — ABNORMAL LOW
GFR calc Af Amer: 52 — ABNORMAL LOW
GFR calc Af Amer: 54 — ABNORMAL LOW
GFR calc Af Amer: 55 — ABNORMAL LOW
GFR calc Af Amer: >60
GFR calc Af Amer: >60
GFR calc Af Amer: >60
GFR calc Af Amer: >60
GFR calc Af Amer: >60
GFR calc non Af Amer: 50 — ABNORMAL LOW
GFR calc non Af Amer: 39 — ABNORMAL LOW
GFR calc non Af Amer: 42 — ABNORMAL LOW
GFR calc non Af Amer: 42 — ABNORMAL LOW
GFR calc non Af Amer: 43 — ABNORMAL LOW
GFR calc non Af Amer: 45 — ABNORMAL LOW
GFR calc non Af Amer: 45 — ABNORMAL LOW
GFR calc non Af Amer: 50 — ABNORMAL LOW
GFR calc non Af Amer: 54 — ABNORMAL LOW
GFR calc non Af Amer: 54 — ABNORMAL LOW
GFR calc non Af Amer: 55 — ABNORMAL LOW
GFR calc non Af Amer: >60
Glucose, Bld: 216 — ABNORMAL HIGH
Glucose, Bld: 120 — ABNORMAL HIGH
Glucose, Bld: 127 — ABNORMAL HIGH
Glucose, Bld: 136 — ABNORMAL HIGH
Glucose, Bld: 142 — ABNORMAL HIGH
Glucose, Bld: 145 — ABNORMAL HIGH
Glucose, Bld: 171 — ABNORMAL HIGH
Glucose, Bld: 171 — ABNORMAL HIGH
Glucose, Bld: 178 — ABNORMAL HIGH
Glucose, Bld: 60 — ABNORMAL LOW
Glucose, Bld: 90
Glucose, Bld: 95
Potassium: 3.3 — ABNORMAL LOW
Potassium: 2.6 — CL
Potassium: 2.8 — ABNORMAL LOW
Potassium: 2.9 — ABNORMAL LOW
Potassium: 3 — ABNORMAL LOW
Potassium: 3.1 — ABNORMAL LOW
Potassium: 3.4 — ABNORMAL LOW
Potassium: 3.4 — ABNORMAL LOW
Potassium: 4.3
Potassium: 4.3
Potassium: 4.6
Potassium: 5.4 — ABNORMAL HIGH
Sodium: 137
Sodium: 133 — ABNORMAL LOW
Sodium: 133 — ABNORMAL LOW
Sodium: 133 — ABNORMAL LOW
Sodium: 135
Sodium: 136
Sodium: 137
Sodium: 137
Sodium: 137
Sodium: 138
Sodium: 138
Sodium: 140
Total Bilirubin: 1.8 — ABNORMAL HIGH
Total Bilirubin: 0.5
Total Bilirubin: 0.6
Total Bilirubin: 0.7
Total Bilirubin: 0.8
Total Bilirubin: 0.8
Total Bilirubin: 0.9
Total Bilirubin: 0.9
Total Bilirubin: 1
Total Bilirubin: 1.1
Total Bilirubin: 1.2
Total Bilirubin: 1.5 — ABNORMAL HIGH
Total Protein: 6.3
Total Protein: 3.9 — ABNORMAL LOW
Total Protein: 3.9 — ABNORMAL LOW
Total Protein: 4.3 — ABNORMAL LOW
Total Protein: 4.4 — ABNORMAL LOW
Total Protein: 4.6 — ABNORMAL LOW
Total Protein: 4.7 — ABNORMAL LOW
Total Protein: 4.8 — ABNORMAL LOW
Total Protein: 4.9 — ABNORMAL LOW
Total Protein: 4.9 — ABNORMAL LOW
Total Protein: 5.3 — ABNORMAL LOW
Total Protein: 5.8 — ABNORMAL LOW

## 2011-06-01 LAB — ABO/RH: ABO/RH(D): O POS

## 2011-06-01 LAB — MAGNESIUM
Magnesium: 1.3 — ABNORMAL LOW
Magnesium: 1.3 — ABNORMAL LOW
Magnesium: 1.6
Magnesium: 1.6
Magnesium: 1.8
Magnesium: 2

## 2011-06-01 LAB — URINE MICROSCOPIC-ADD ON

## 2011-06-01 LAB — CBC
HCT: 41.2
HCT: 23.2 — ABNORMAL LOW
HCT: 23.3 — ABNORMAL LOW
HCT: 23.4 — ABNORMAL LOW
HCT: 24 — ABNORMAL LOW
HCT: 24.9 — ABNORMAL LOW
HCT: 25.4 — ABNORMAL LOW
HCT: 26.2 — ABNORMAL LOW
HCT: 27 — ABNORMAL LOW
HCT: 27.1 — ABNORMAL LOW
HCT: 28.3 — ABNORMAL LOW
HCT: 28.6 — ABNORMAL LOW
HCT: 28.6 — ABNORMAL LOW
HCT: 30.7 — ABNORMAL LOW
HCT: 34.5 — ABNORMAL LOW
HCT: 40.4
Hemoglobin: 14.1
Hemoglobin: 10 — ABNORMAL LOW
Hemoglobin: 10.5 — ABNORMAL LOW
Hemoglobin: 11.8 — ABNORMAL LOW
Hemoglobin: 13.7
Hemoglobin: 8.1 — ABNORMAL LOW
Hemoglobin: 8.1 — ABNORMAL LOW
Hemoglobin: 8.1 — ABNORMAL LOW
Hemoglobin: 8.2 — ABNORMAL LOW
Hemoglobin: 8.3 — ABNORMAL LOW
Hemoglobin: 8.8 — ABNORMAL LOW
Hemoglobin: 9.3 — ABNORMAL LOW
Hemoglobin: 9.3 — ABNORMAL LOW
Hemoglobin: 9.5 — ABNORMAL LOW
Hemoglobin: 9.9 — ABNORMAL LOW
Hemoglobin: 9.9 — ABNORMAL LOW
MCHC: 34.1
MCHC: 33.4
MCHC: 33.9
MCHC: 34
MCHC: 34.2
MCHC: 34.2
MCHC: 34.3
MCHC: 34.5
MCHC: 34.7
MCHC: 34.7
MCHC: 34.8
MCHC: 35.1
MCHC: 35.1
MCHC: 35.1
MCHC: 35.1
MCHC: 35.4
MCV: 93.8
MCV: 91.1
MCV: 92.8
MCV: 93.5
MCV: 93.6
MCV: 93.9
MCV: 94.1
MCV: 94.1
MCV: 94.1
MCV: 94.4
MCV: 94.5
MCV: 94.5
MCV: 94.5
MCV: 94.8
MCV: 95.1
MCV: 95.2
Platelets: 227
Platelets: 162
Platelets: 200
Platelets: 214
Platelets: 256
Platelets: 273
Platelets: 284
Platelets: 290
Platelets: 293
Platelets: 317
Platelets: 330
Platelets: 331
Platelets: 365
Platelets: 422 — ABNORMAL HIGH
Platelets: 465 — ABNORMAL HIGH
Platelets: 543 — ABNORMAL HIGH
RBC: 4.39
RBC: 2.48 — ABNORMAL LOW
RBC: 2.48 — ABNORMAL LOW
RBC: 2.51 — ABNORMAL LOW
RBC: 2.55 — ABNORMAL LOW
RBC: 2.64 — ABNORMAL LOW
RBC: 2.73 — ABNORMAL LOW
RBC: 2.76 — ABNORMAL LOW
RBC: 2.86 — ABNORMAL LOW
RBC: 2.87 — ABNORMAL LOW
RBC: 2.97 — ABNORMAL LOW
RBC: 3.04 — ABNORMAL LOW
RBC: 3.05 — ABNORMAL LOW
RBC: 3.37 — ABNORMAL LOW
RBC: 3.64 — ABNORMAL LOW
RBC: 4.28
RDW: 12.4
RDW: 12.9
RDW: 13
RDW: 13.4
RDW: 13.4
RDW: 13.6
RDW: 13.7
RDW: 13.7
RDW: 13.7
RDW: 13.8
RDW: 13.8
RDW: 13.8
RDW: 14
RDW: 14
RDW: 14.2
RDW: 14.9
WBC: 14.8 — ABNORMAL HIGH
WBC: 10.4
WBC: 11.3 — ABNORMAL HIGH
WBC: 11.6 — ABNORMAL HIGH
WBC: 13.7 — ABNORMAL HIGH
WBC: 14.9 — ABNORMAL HIGH
WBC: 16.9 — ABNORMAL HIGH
WBC: 17 — ABNORMAL HIGH
WBC: 17.4 — ABNORMAL HIGH
WBC: 18.4 — ABNORMAL HIGH
WBC: 19.3 — ABNORMAL HIGH
WBC: 19.7 — ABNORMAL HIGH
WBC: 21.1 — ABNORMAL HIGH
WBC: 22 — ABNORMAL HIGH
WBC: 24.1 — ABNORMAL HIGH
WBC: 25.7 — ABNORMAL HIGH

## 2011-06-01 LAB — URINALYSIS, ROUTINE W REFLEX MICROSCOPIC
Bilirubin Urine: NEGATIVE
Bilirubin Urine: NEGATIVE
Bilirubin Urine: NEGATIVE
Bilirubin Urine: NEGATIVE
Glucose, UA: 250 — AB
Glucose, UA: 100 — AB
Glucose, UA: NEGATIVE
Glucose, UA: NEGATIVE
Hgb urine dipstick: NEGATIVE
Ketones, ur: 15 — AB
Ketones, ur: >80 — AB
Ketones, ur: NEGATIVE
Ketones, ur: NEGATIVE
Leukocytes, UA: NEGATIVE
Leukocytes, UA: NEGATIVE
Leukocytes, UA: NEGATIVE
Leukocytes, UA: NEGATIVE
Nitrite: NEGATIVE
Nitrite: NEGATIVE
Nitrite: NEGATIVE
Nitrite: NEGATIVE
Protein, ur: 30 — AB
Protein, ur: 100 — AB
Protein, ur: 100 — AB
Protein, ur: NEGATIVE
Specific Gravity, Urine: 1.018
Specific Gravity, Urine: 1.01
Specific Gravity, Urine: 1.017
Specific Gravity, Urine: 1.023
Urobilinogen, UA: 1
Urobilinogen, UA: 0.2
Urobilinogen, UA: 1
Urobilinogen, UA: 1
pH: 5.5
pH: 5
pH: 5.5
pH: 5.5

## 2011-06-01 LAB — DIFFERENTIAL
Band Neutrophils: 10
Basophils Absolute: 0
Basophils Absolute: 0
Basophils Absolute: 0
Basophils Absolute: 0
Basophils Absolute: 0
Basophils Absolute: 0
Basophils Absolute: 0
Basophils Absolute: 0
Basophils Absolute: 0
Basophils Absolute: 0
Basophils Relative: 0
Basophils Relative: 0
Basophils Relative: 0
Basophils Relative: 0
Basophils Relative: 0
Basophils Relative: 0
Basophils Relative: 0
Basophils Relative: 0
Basophils Relative: 0
Basophils Relative: 0
Blasts: 0
Eosinophils Absolute: 0
Eosinophils Absolute: 0
Eosinophils Absolute: 0
Eosinophils Absolute: 0
Eosinophils Absolute: 0
Eosinophils Absolute: 0
Eosinophils Absolute: 0
Eosinophils Absolute: 0
Eosinophils Absolute: 0
Eosinophils Absolute: 0.1
Eosinophils Relative: 0
Eosinophils Relative: 0
Eosinophils Relative: 0
Eosinophils Relative: 0
Eosinophils Relative: 0
Eosinophils Relative: 0
Eosinophils Relative: 0
Eosinophils Relative: 0
Eosinophils Relative: 0
Eosinophils Relative: 0
Lymphocytes Relative: 7 — ABNORMAL LOW
Lymphocytes Relative: 2 — ABNORMAL LOW
Lymphocytes Relative: 3 — ABNORMAL LOW
Lymphocytes Relative: 3 — ABNORMAL LOW
Lymphocytes Relative: 4 — ABNORMAL LOW
Lymphocytes Relative: 4 — ABNORMAL LOW
Lymphocytes Relative: 6 — ABNORMAL LOW
Lymphocytes Relative: 7 — ABNORMAL LOW
Lymphocytes Relative: 8 — ABNORMAL LOW
Lymphocytes Relative: 8 — ABNORMAL LOW
Lymphs Abs: 1
Lymphs Abs: 0.5 — ABNORMAL LOW
Lymphs Abs: 0.6 — ABNORMAL LOW
Lymphs Abs: 0.6 — ABNORMAL LOW
Lymphs Abs: 0.6 — ABNORMAL LOW
Lymphs Abs: 0.7
Lymphs Abs: 0.8
Lymphs Abs: 0.8
Lymphs Abs: 0.9
Lymphs Abs: 0.9
Metamyelocytes Relative: 0
Monocytes Absolute: 0.9
Monocytes Absolute: 0.4
Monocytes Absolute: 0.4
Monocytes Absolute: 0.5
Monocytes Absolute: 0.5
Monocytes Absolute: 0.5
Monocytes Absolute: 0.9
Monocytes Absolute: 0.9
Monocytes Absolute: 1.4 — ABNORMAL HIGH
Monocytes Absolute: 1.4 — ABNORMAL HIGH
Monocytes Relative: 6
Monocytes Relative: 2 — ABNORMAL LOW
Monocytes Relative: 2 — ABNORMAL LOW
Monocytes Relative: 3
Monocytes Relative: 4
Monocytes Relative: 4
Monocytes Relative: 5
Monocytes Relative: 8
Monocytes Relative: 8
Monocytes Relative: 8
Myelocytes: 0
Neutro Abs: 13 — ABNORMAL HIGH
Neutro Abs: 10.1 — ABNORMAL HIGH
Neutro Abs: 13.9 — ABNORMAL HIGH
Neutro Abs: 14.6 — ABNORMAL HIGH
Neutro Abs: 17 — ABNORMAL HIGH
Neutro Abs: 17.5 — ABNORMAL HIGH
Neutro Abs: 20.4 — ABNORMAL HIGH
Neutro Abs: 23 — ABNORMAL HIGH
Neutro Abs: 8.6 — ABNORMAL HIGH
Neutro Abs: 9 — ABNORMAL HIGH
Neutrophils Relative %: 88 — ABNORMAL HIGH
Neutrophils Relative %: 74
Neutrophils Relative %: 86 — ABNORMAL HIGH
Neutrophils Relative %: 87 — ABNORMAL HIGH
Neutrophils Relative %: 88 — ABNORMAL HIGH
Neutrophils Relative %: 89 — ABNORMAL HIGH
Neutrophils Relative %: 93 — ABNORMAL HIGH
Neutrophils Relative %: 93 — ABNORMAL HIGH
Neutrophils Relative %: 95 — ABNORMAL HIGH
Neutrophils Relative %: 96 — ABNORMAL HIGH
Promyelocytes Absolute: 0
nRBC: 0

## 2011-06-01 LAB — POCT CARDIAC MARKERS
CKMB, poc: 1.3
CKMB, poc: 1.5
CKMB, poc: <1 — ABNORMAL LOW
Myoglobin, poc: 49.7
Myoglobin, poc: 59.4
Myoglobin, poc: 68.2
Operator id: 265201
Operator id: 265201
Operator id: 270651
Troponin i, poc: <0.05
Troponin i, poc: <0.05
Troponin i, poc: <0.05

## 2011-06-01 LAB — URINE CULTURE
Colony Count: NO GROWTH
Colony Count: NO GROWTH
Culture: NO GROWTH
Culture: NO GROWTH

## 2011-06-01 LAB — CROSSMATCH
ABO/RH(D): O POS
Antibody Screen: NEGATIVE

## 2011-06-01 LAB — CMV IGM: CMV IgM: <0.9 Index (ref <0.90)

## 2011-06-01 LAB — HEMOGLOBIN A1C
Hgb A1c MFr Bld: 6
Mean Plasma Glucose: 136

## 2011-06-01 LAB — LIPASE, BLOOD
Lipase: >2000 — ABNORMAL HIGH
Lipase: 1351 — ABNORMAL HIGH
Lipase: 26
Lipase: 26
Lipase: 30
Lipase: 32
Lipase: 391 — ABNORMAL HIGH

## 2011-06-01 LAB — AMYLASE
Amylase: 65
Amylase: 80
Amylase: 90

## 2011-06-01 LAB — HEPATITIS PANEL, ACUTE
HCV Ab: NEGATIVE
Hep A IgM: NEGATIVE
Hep B C IgM: NEGATIVE
Hepatitis B Surface Ag: NEGATIVE

## 2011-06-01 LAB — LIPID PANEL
Cholesterol: 121
HDL: 49
LDL Cholesterol: 64
Total CHOL/HDL Ratio: 2.5
Triglycerides: 41
VLDL: 8

## 2011-06-01 LAB — PROTIME-INR
INR: 0.9
Prothrombin Time: 12.7

## 2011-06-01 LAB — LACTATE DEHYDROGENASE: LDH: 505 — ABNORMAL HIGH

## 2011-06-01 LAB — CALCIUM, IONIZED: Calcium, Ion: 0.74 — ABNORMAL LOW

## 2011-06-01 LAB — CYTOMEGALOVIRUS ANTIBODY, IGG: Cytomegalovirus(CMV) Antibody, IgG: POSITIVE — AB

## 2011-06-01 LAB — TRIGLYCERIDES: Triglycerides: 53

## 2011-06-01 LAB — EPSTEIN BARR VIRUS(EBV) BY PCR: EBV DNA, PCR: NEGATIVE

## 2011-06-01 LAB — APTT: aPTT: 24

## 2011-06-02 LAB — AMYLASE: Amylase: 76

## 2011-06-02 LAB — BASIC METABOLIC PANEL
BUN: 5 — ABNORMAL LOW
CO2: 28
Calcium: 7.8 — ABNORMAL LOW
Chloride: 95 — ABNORMAL LOW
Creatinine, Ser: 1.21 — ABNORMAL HIGH
GFR calc Af Amer: 54 — ABNORMAL LOW
GFR calc non Af Amer: 45 — ABNORMAL LOW
Glucose, Bld: 133 — ABNORMAL HIGH
Potassium: 3.4 — ABNORMAL LOW
Sodium: 132 — ABNORMAL LOW

## 2011-06-02 LAB — CBC
HCT: 32.7 — ABNORMAL LOW
Hemoglobin: 11 — ABNORMAL LOW
MCHC: 33.7
MCV: 92.3
Platelets: 655 — ABNORMAL HIGH
RBC: 3.55 — ABNORMAL LOW
RDW: 14.3
WBC: 14.7 — ABNORMAL HIGH

## 2011-06-02 LAB — HEMOGLOBIN A1C
Hgb A1c MFr Bld: 6
Mean Plasma Glucose: 136

## 2011-06-02 LAB — LIPASE, BLOOD: Lipase: 30

## 2011-06-16 ENCOUNTER — Ambulatory Visit
Admission: RE | Admit: 2011-06-16 | Discharge: 2011-06-16 | Disposition: A | Discharge status: Home / Self Care | Payer: Medicare Other | Source: Ambulatory Visit | Attending: Gynecology | Admitting: Gynecology

## 2011-06-16 DIAGNOSIS — Z1231 Encounter for screening mammogram for malignant neoplasm of breast: Secondary | ICD-10-CM

## 2011-06-23 ENCOUNTER — Encounter: Payer: Self-pay | Admitting: Cardiovascular Disease

## 2011-06-28 ENCOUNTER — Ambulatory Visit (INDEPENDENT_AMBULATORY_CARE_PROVIDER_SITE_OTHER): Payer: Medicare Other | Admitting: Cardiovascular Disease

## 2011-06-28 ENCOUNTER — Encounter: Payer: Self-pay | Admitting: Cardiovascular Disease

## 2011-06-28 VITALS — BP 153/79 | HR 62 | Ht 63.0 in | Wt 145.4 lb

## 2011-06-28 DIAGNOSIS — I2510 Atherosclerotic heart disease of native coronary artery without angina pectoris: Secondary | ICD-10-CM

## 2011-06-28 DIAGNOSIS — E785 Hyperlipidemia, unspecified: Secondary | ICD-10-CM | POA: Insufficient documentation

## 2011-06-28 DIAGNOSIS — I10 Essential (primary) hypertension: Secondary | ICD-10-CM | POA: Insufficient documentation

## 2011-06-28 MED ORDER — LOSARTAN POTASSIUM 100 MG PO TABS: 100.0000 mg | ORAL_TABLET | Freq: Every day | ORAL | Status: DC

## 2011-06-28 MED ORDER — NEBIVOLOL HCL 20 MG PO TABS: 20.0000 mg | ORAL_TABLET | ORAL | Status: DC

## 2011-06-28 MED ORDER — AMLODIPINE BESYLATE 5 MG PO TABS: 5.0000 mg | ORAL_TABLET | Freq: Every day | ORAL | Status: DC

## 2011-06-28 MED ORDER — NITROGLYCERIN 0.4 MG SL SUBL: 0.4000 mg | SUBLINGUAL_TABLET | SUBLINGUAL | Status: DC | PRN

## 2011-06-28 NOTE — Assessment & Plan Note (Signed)
She had a history of coronary artery disease involving the terminal left anterior descending artery. She had a very small apical infarct several years ago. We'll continue with her same medications.

## 2011-06-28 NOTE — Assessment & Plan Note (Signed)
We'll continue to follow her lipid levels closely. We'll check again see her again in 6 months.

## 2011-06-28 NOTE — Patient Instructions (Addendum)
Your physician recommends that you schedule a follow-up appointment in: 1 month/ with lab draw   Your physician has recommended you make the following change in your medication:  1) start amlodipine 5mg  one tablet daily.  DASH diet

## 2011-06-28 NOTE — Progress Notes (Signed)
[name] Date of Birth  1946-05-06 [facility] [address]    [address] [place], [place]  [address] [phone]  Fax  [phone]  History of Present Illness:  65 yo female with a hx of CAD - s/p inferior apical  MI ( stenosis of the terminal LAD 2007).  She's had a problem with blood pressure issues. Her blood pressure has been elevated since April. She's been in [place] taking care of her father for the past 14 months.  She recently came back home after he passed away.  She has been started on Bystolic  by her medical doctor. She's also been started on Cozaar. Despite these medications, her blood pressure remains elevated. She doesn't need any exercise. She has gained 15 pounds since I last saw her.  She's been exercising 3 times a week.  Current Outpatient Prescriptions on File Prior to Visit  Medication Sig Dispense Refill  . aspirin 81 MG tablet Take 81 mg by mouth daily.        . Cholecalciferol (VITAMIN D) 2000 UNITS tablet Take 2,000 Units by mouth daily.        . citalopram (CELEXA) 40 MG tablet Take 40 mg by mouth daily.        . nitroGLYCERIN (NITROSTAT) 0.4 MG SL tablet Place 0.4 mg under the tongue every 5 (five) minutes as needed.          Allergies  Allergen Reactions  . Plavix (Clopidogrel Bisulfate)     Past Medical History  Diagnosis Date  . Coronary artery disease   . 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 Hyperlipidemia   . Hypertension   . Headache, acute   . Chest pain     Past Surgical History  Procedure Date  . Cardiac catheterization 08/12/2006    SINGLE VESSEL CAD    History  Smoking status  . Never Smoker   Smokeless tobacco  . Not on file    History  Alcohol Use No    No family history on file.  Reviw of Systems:  Reviewed in the HPI.  All other systems are negative.  Physical Exam: BP 153/79  Pulse 62  Ht 5\' 3"  (1.6 m)  Wt 145 lb 6.4 oz (65.953 kg)  BMI 25.76 kg/m2 The patient is alert and oriented x 3.  The mood  and affect are normal.   Skin: warm and dry.  Color is normal.    HEENT:   Her neck is supple. Her mucous membranes are moist. No JVD. Carotids are normal.  Lungs: Lungs are clear.   Heart: Regular S1-S2. She has no S3 gallop.    Abdomen: Her abdomen is soft. There are no masses. She has no bruits.  Extremities:  She has no edema to  Neuro:  Nonfocal. Her gait is normal     ECG: Normal sinus rhythm. EKG is normal.  Assessment / Plan:

## 2011-06-28 NOTE — Assessment & Plan Note (Signed)
[name] presents with an elevated blood pressure. She's gained a little bit of weight since I last saw her. In addition she's been under lots of stress.  She's tried HCTZ in the past. She developed pancreatitis shortly after that .  It's possible that the HCTZ contributed to the pancreatitis. It is listed in the PDR as a complication.  She's been on it for years prior to developing pancreatitis.  We'll start her on amlodipine 5 mg a day. She will continue with her same medications otherwise. I've asked her to work very hard on her diet and exercise program. I feel sure that weight loss will help her hypertension

## 2011-08-10 ENCOUNTER — Ambulatory Visit (INDEPENDENT_AMBULATORY_CARE_PROVIDER_SITE_OTHER): Payer: Medicare Other | Admitting: Cardiovascular Disease

## 2011-08-10 ENCOUNTER — Encounter: Payer: Self-pay | Admitting: Cardiovascular Disease

## 2011-08-10 DIAGNOSIS — E785 Hyperlipidemia, unspecified: Secondary | ICD-10-CM

## 2011-08-10 DIAGNOSIS — I2510 Atherosclerotic heart disease of native coronary artery without angina pectoris: Secondary | ICD-10-CM

## 2011-08-10 MED ORDER — AMLODIPINE BESYLATE 5 MG PO TABS: 5.0000 mg | ORAL_TABLET | Freq: Every day | ORAL | Status: DC

## 2011-08-10 NOTE — Assessment & Plan Note (Signed)
Jone has been changed from  simvastatin 20 mg a day. She chose the simvastatin because the atorvastatin was not at a generic price.  She now is on amlodipine 5 mg a day which seems to be working for her hypertension.  While this dose of simvastatin is acceptable with the amlodipine, I would prefer that she started atorvastatin so that we can minimize future interactions. She will check with her pharmacist to see with a price of generic atorvastatin is. I think that he will be comparable in price at this point.

## 2011-08-10 NOTE — Patient Instructions (Signed)
Your physician wants you to follow-up in:  6 months. You will receive a reminder letter in the mail two months in advance. If you don't receive a letter, please call our office to schedule the follow-up appointment.   

## 2011-08-10 NOTE — Assessment & Plan Note (Signed)
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 Her EKG has normalized. We'll continue to follow her and keep her Cholesterol  levels under good control.

## 2011-08-10 NOTE — Progress Notes (Signed)
[name] Date of Birth  March 11, 1946 [facility] [address]    [address] [place], [place]  [address] [phone]  Fax  [phone]  History of Present Illness:  65 yo female with a hx of CAD - s/p inferior apical  MI ( stenosis of the terminal PDA 2007).  She's had a problem with blood pressure issues.   She has been started on Bystolic  by her medical doctor. She's also been started on Cozaar. Despite these medications, her blood pressure remains elevated.   She's been doing water aerobics until the heater when out in the pool.    Her blood pressure readings have been well controlled at home.  Current Outpatient Prescriptions on File Prior to Visit  Medication Sig Dispense Refill  . amLODipine (NORVASC) 5 MG tablet Take 1 tablet (5 mg total) by mouth daily.  30 tablet  5  . aspirin 81 MG tablet Take 81 mg by mouth daily.        . Cholecalciferol (VITAMIN D) 2000 UNITS tablet Take 2,000 Units by mouth daily.        . citalopram (CELEXA) 40 MG tablet Take 40 mg by mouth daily.        . cloNIDine (CATAPRES) 0.2 MG tablet Take 0.2 mg by mouth as needed.        Marland Kitchen losartan (COZAAR) 100 MG tablet Take 1 tablet (100 mg total) by mouth daily.  90 tablet  1  . Nebivolol HCl (BYSTOLIC) 20 MG TABS Take 1 tablet (20 mg total) by mouth 1 day or 1 dose.  90 tablet  1  . nitroGLYCERIN (NITROSTAT) 0.4 MG SL tablet Place 1 tablet (0.4 mg total) under the tongue every 5 (five) minutes as needed.  50 tablet  1  Simvastatin 20 mg a day  Allergies  Allergen Reactions  . Plavix (Clopidogrel Bisulfate)     Past Medical History  Diagnosis Date  . Coronary artery disease   . 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 Hyperlipidemia   . Hypertension   . Headache, acute   . Chest pain     Past Surgical History  Procedure Date  . Cardiac catheterization 08/12/2006    SINGLE VESSEL CAD    History  Smoking status  . Never Smoker   Smokeless tobacco  . Not on file    History  Alcohol Use  No    No family history on file.  Reviw of Systems:  Reviewed in the HPI.  All other systems are negative.  Physical Exam: BP 133/82  Pulse 52  Ht 5\' 3"  (1.6 m)  Wt 147 lb 12.8 oz (67.042 kg)  BMI 26.18 kg/m2 The patient is alert and oriented x 3.  The mood and affect are normal.   Skin: warm and dry.  Color is normal.    HEENT:   Her neck is supple. Her mucous membranes are moist. No JVD. Carotids are normal.  Lungs: Lungs are clear.   Heart: Regular S1-S2. She has no S3 gallop.    Abdomen: Her abdomen is soft. There are no masses. She has no bruits.  Extremities:  She has no edema to  Neuro:  Nonfocal. Her gait is normal     ECG: Normal sinus rhythm. EKG is normal.  Assessment / Plan:

## 2011-12-29 ENCOUNTER — Other Ambulatory Visit: Payer: Self-pay | Admitting: *Deleted

## 2011-12-29 MED ORDER — NEBIVOLOL HCL 20 MG PO TABS: 20.0000 mg | ORAL_TABLET | ORAL | Status: DC

## 2011-12-29 NOTE — Telephone Encounter (Signed)
When Pt come to Appt in July, Pt will get more refills. Fax Received. Refill Completed. [name] (R.M.A)

## 2012-01-03 ENCOUNTER — Other Ambulatory Visit: Payer: Self-pay | Admitting: *Deleted

## 2012-03-13 ENCOUNTER — Encounter: Payer: Self-pay | Admitting: Cardiovascular Disease

## 2012-03-13 ENCOUNTER — Ambulatory Visit (INDEPENDENT_AMBULATORY_CARE_PROVIDER_SITE_OTHER): Payer: Medicare Other | Admitting: Cardiovascular Disease

## 2012-03-13 VITALS — BP 128/74 | HR 57 | Ht 63.0 in | Wt 142.0 lb

## 2012-03-13 DIAGNOSIS — I2510 Atherosclerotic heart disease of native coronary artery without angina pectoris: Secondary | ICD-10-CM

## 2012-03-13 MED ORDER — AMLODIPINE BESYLATE 5 MG PO TABS: 5.0000 mg | ORAL_TABLET | Freq: Every day | ORAL | Status: DC

## 2012-03-13 NOTE — Patient Instructions (Addendum)
Your physician wants you to follow-up in: 6 months  You will receive a reminder letter in the mail two months in advance. If you don't receive a letter, please call our office to schedule the follow-up appointment.  Your physician recommends that you return for a FASTING lipid profile: 6 months   

## 2012-03-13 NOTE — Assessment & Plan Note (Signed)
[name] is  doing very well. She's not had any episodes of chest pain or shortness of breath. We'll continue with her same medications. She'll have her lipids checked by her medical doctor next week. We'll see her again in 6 months for office visit, fasting lipids, H&P, and basic metabolic profile. We will also get an EKG.

## 2012-03-13 NOTE — Progress Notes (Signed)
Sheliah Russell Date of Birth  1945/09/27       [facility] Office [address]  [address] [place], [place]  [address]   [place], [place]  [address] [phone]     [phone]   Fax  [phone]    Fax [phone]  Problem List: 1. Coronary artery disease-status post apical inferior myocardial infarction by cardiac catheterization-December, 2007 2. Hyperlipidemia 3. Hypertension  History of Present Illness:  [name] is a 66 yo with hx of CAD - she has done well for the past several years.   She has felt well.  Her BP has been well controlled.  She was in [place], [place] for the past several weeks working on her dad's house.  She has lost 5 pounds .   She has had some mild orthostasis over the past month.  Current Outpatient Prescriptions on File Prior to Visit  Medication Sig Dispense Refill  . amLODipine (NORVASC) 5 MG tablet Take 1 tablet (5 mg total) by mouth daily.  90 tablet  3  . aspirin 81 MG tablet Take 81 mg by mouth daily.        . cetirizine (ZYRTEC) 10 MG tablet Take 10 mg by mouth daily.      . Cholecalciferol (VITAMIN D) 2000 UNITS tablet Take 2,000 Units by mouth daily.        . citalopram (CELEXA) 40 MG tablet Take 40 mg by mouth daily.        . cloNIDine (CATAPRES) 0.2 MG tablet Take 0.2 mg by mouth as needed.        Marland Kitchen ESTRACE VAGINAL 0.1 MG/GM vaginal cream Place vaginally 3 (three) times a week.       . losartan (COZAAR) 100 MG tablet Take 1 tablet (100 mg total) by mouth daily.  90 tablet  1  . Nebivolol HCl (BYSTOLIC) 20 MG TABS Take 1 tablet (20 mg total) by mouth 1 day or 1 dose.  90 tablet  0  . nitroGLYCERIN (NITROSTAT) 0.4 MG SL tablet Place 1 tablet (0.4 mg total) under the tongue every 5 (five) minutes as needed.  50 tablet  1  . simvastatin (ZOCOR) 20 MG tablet         Allergies  Allergen Reactions  . Plavix (Clopidogrel Bisulfate)     Past Medical History  Diagnosis Date  . Coronary artery  disease   . 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 Hyperlipidemia   . Hypertension   . Headache, acute   . Chest pain     Past Surgical History  Procedure Date  . Cardiac catheterization 08/12/2006    SINGLE VESSEL CAD    History  Smoking status  . Never Smoker   Smokeless tobacco  . Not on file    History  Alcohol Use No    No family history on file.  Reviw of Systems:  Reviewed in the HPI.  All other systems are negative.  Physical Exam: Blood pressure 128/74, pulse 57, height 5\' 3"  (1.6 m), weight 142 lb (64.411 kg). General: Well developed, well nourished, in no acute distress.  Head: Normocephalic, atraumatic, sclera non-icteric, mucus membranes are moist,   Neck: Supple. Carotids are 2 + without bruits. No JVD  Lungs: Clear bilaterally to auscultation.  Heart: regular rate.  normal  S1 S2. No murmurs, gallops or rubs.  Abdomen: Soft, non-tender, non-distended with normal bowel sounds. No hepatomegaly. No  rebound/guarding. No masses.  Msk:  Strength and tone are normal  Extremities: No clubbing or cyanosis. No edema.  Distal pedal pulses are 2+ and equal bilaterally.  Neuro: Alert and oriented X 3. Moves all extremities spontaneously.  Psych:  Responds to questions appropriately with a normal affect.  ECG:  Assessment / Plan:

## 2012-05-18 ENCOUNTER — Other Ambulatory Visit: Payer: Self-pay | Admitting: Gynecology

## 2012-05-18 DIAGNOSIS — Z1231 Encounter for screening mammogram for malignant neoplasm of breast: Secondary | ICD-10-CM

## 2012-06-20 ENCOUNTER — Ambulatory Visit
Admission: RE | Admit: 2012-06-20 | Discharge: 2012-06-20 | Disposition: A | Discharge status: Home / Self Care | Payer: Medicare Other | Source: Ambulatory Visit | Attending: Gynecology | Admitting: Gynecology

## 2012-06-20 DIAGNOSIS — Z1231 Encounter for screening mammogram for malignant neoplasm of breast: Secondary | ICD-10-CM

## 2012-08-18 ENCOUNTER — Other Ambulatory Visit: Payer: Self-pay | Admitting: *Deleted

## 2012-08-18 MED ORDER — AMLODIPINE BESYLATE 5 MG PO TABS: 5.0000 mg | ORAL_TABLET | Freq: Every day | ORAL | Status: DC

## 2012-08-18 NOTE — Telephone Encounter (Signed)
Fax Received. Refill Completed. [name] (R.M.A)   

## 2012-08-21 ENCOUNTER — Other Ambulatory Visit: Payer: Self-pay | Admitting: *Deleted

## 2012-08-21 NOTE — Telephone Encounter (Signed)
Opened in Error.

## 2012-09-18 ENCOUNTER — Telehealth: Payer: Self-pay | Admitting: Cardiovascular Disease

## 2012-09-18 ENCOUNTER — Telehealth: Payer: Self-pay | Admitting: *Deleted

## 2012-09-18 NOTE — Telephone Encounter (Signed)
Walk in, request cardiac clearance for cosmetic surgery to be done 09/27/12 under local anesthesia with Dr Stephens November. Please advise. We will fax letter to  Fax [phone] or call with questions [phone] then call pt to confirm when done,

## 2012-09-18 NOTE — Telephone Encounter (Signed)
Walk in Pt Form " Pt Having Procedure w/ Dr.Holdreness on 09/27/12 ( mini Face Life) Pt Needs Cardiac Clearance.  Sent to Jodette/Nahser  09/18/12/KM

## 2012-09-19 ENCOUNTER — Encounter: Payer: Self-pay | Admitting: *Deleted

## 2012-09-19 NOTE — Telephone Encounter (Signed)
[name] is at low risk for cosmetic surgery.  I would prefer that she continue her aspirin if that is OK with Dr. Stephens November.  If continuing the aspirin is not acceptable, it will be OK to hold for 5 days.

## 2012-09-19 NOTE — Telephone Encounter (Signed)
Pt informed and letter taken to MR.

## 2012-09-25 ENCOUNTER — Encounter: Payer: Self-pay | Admitting: Cardiovascular Disease

## 2012-09-25 ENCOUNTER — Ambulatory Visit (INDEPENDENT_AMBULATORY_CARE_PROVIDER_SITE_OTHER): Payer: Medicare Other | Admitting: Cardiovascular Disease

## 2012-09-25 VITALS — BP 130/72 | HR 59 | Resp 18 | Ht 63.0 in | Wt 140.0 lb

## 2012-09-25 DIAGNOSIS — I2510 Atherosclerotic heart disease of native coronary artery without angina pectoris: Secondary | ICD-10-CM

## 2012-09-25 DIAGNOSIS — E785 Hyperlipidemia, unspecified: Secondary | ICD-10-CM

## 2012-09-25 DIAGNOSIS — I10 Essential (primary) hypertension: Secondary | ICD-10-CM

## 2012-09-25 LAB — HEPATIC FUNCTION PANEL
ALT: 20 U/L (ref 0–35)
AST: 19 U/L (ref 0–37)
Albumin: 4.2 g/dL (ref 3.5–5.2)
Alkaline Phosphatase: 75 U/L (ref 39–117)
Bilirubin, Direct: 0 mg/dL (ref 0.0–0.3)
Total Bilirubin: 0.5 mg/dL (ref 0.3–1.2)
Total Protein: 7.1 g/dL (ref 6.0–8.3)

## 2012-09-25 LAB — LIPID PANEL
Cholesterol: 114 mg/dL (ref 0–200)
HDL: 41 mg/dL (ref >39.00)
LDL Cholesterol: 59 mg/dL (ref 0–99)
Total CHOL/HDL Ratio: 3
Triglycerides: 72 mg/dL (ref 0.0–149.0)
VLDL: 14.4 mg/dL (ref 0.0–40.0)

## 2012-09-25 LAB — BASIC METABOLIC PANEL
BUN: 29 mg/dL — ABNORMAL HIGH (ref 6–23)
CO2: 29 mEq/L (ref 19–32)
Calcium: 8.9 mg/dL (ref 8.4–10.5)
Chloride: 100 mEq/L (ref 96–112)
Creatinine, Ser: 1.2 mg/dL (ref 0.4–1.2)
GFR: 47.89 mL/min — ABNORMAL LOW (ref >60.00)
Glucose, Bld: 97 mg/dL (ref 70–99)
Potassium: 4.6 mEq/L (ref 3.5–5.1)
Sodium: 136 mEq/L (ref 135–145)

## 2012-09-25 NOTE — Patient Instructions (Addendum)
Your physician recommends that you return for a FASTING lipid profile: TODAY  Your physician wants you to follow-up in: 1 YEAR You will receive a reminder letter in the mail two months in advance. If you don't receive a letter, please call our office to schedule the follow-up appointment.

## 2012-09-25 NOTE — Assessment & Plan Note (Signed)
[name] is doing very well.   She has not had any chest pain.  She has been on a good diet and exercise program.  We will continue with her same medications.  I will see her in 1 years.

## 2012-09-25 NOTE — Progress Notes (Signed)
[name] Date of Birth  Jun 27, 1946       [facility] [address]  [address] [place], [place]  [address]   [place], [place]  [address] [phone]     [phone]   Fax  [phone]    Fax [phone]  Problem List: 1. Coronary artery disease-status post apical inferior myocardial infarction by cardiac catheterization-December, 2007 2. Hyperlipidemia 3. Hypertension  History of Present Illness:  [name] is a 67 yo with hx of CAD - she has done well for the past several years.   She has felt well.  Her BP has been well controlled.  She was in [place], [place] for the past several weeks working on her dad's house.  She has lost 5 pounds .   She has had some mild orthostasis over the past month.  September 25, 2012:  She has done well.. No chest pain or dyspnea.  She is exercising regularly and is eating well.    She has been losing weight.     Current Outpatient Prescriptions on File Prior to Visit  Medication Sig Dispense Refill  . amLODipine (NORVASC) 5 MG tablet Take 1 tablet (5 mg total) by mouth daily.  90 tablet  3  . aspirin 81 MG tablet Take 81 mg by mouth daily.        . cetirizine (ZYRTEC) 10 MG tablet Take 10 mg by mouth daily.      . citalopram (CELEXA) 40 MG tablet Take 40 mg by mouth daily.        Marland Kitchen ESTRACE VAGINAL 0.1 MG/GM vaginal cream Place vaginally 3 (three) times a week.       . losartan (COZAAR) 100 MG tablet Take 1 tablet (100 mg total) by mouth daily.  90 tablet  1  . Nebivolol HCl (BYSTOLIC) 20 MG TABS Take 1 tablet (20 mg total) by mouth 1 day or 1 dose.  90 tablet  0  . nitroGLYCERIN (NITROSTAT) 0.4 MG SL tablet Place 1 tablet (0.4 mg total) under the tongue every 5 (five) minutes as needed.  50 tablet  1  . simvastatin (ZOCOR) 20 MG tablet         Allergies  Allergen Reactions  . Plavix (Clopidogrel Bisulfate)     Past Medical History  Diagnosis Date  . Coronary artery disease   . MI,  old     APICAL INFERIOR  . Hyperlipidemia   . Hypertension   . Headache, acute   . Chest pain     Past Surgical History  Procedure Date  . Cardiac catheterization 08/12/2006    SINGLE VESSEL CAD    History  Smoking status  . Never Smoker   Smokeless tobacco  . Not on file    History  Alcohol Use No    No family history on file.  Reviw of Systems:  Reviewed in the HPI.  All other systems are negative.  Physical Exam: Blood pressure 130/72, pulse 59, resp. rate 18, height 5\' 3"  (1.6 m), weight 140 lb (63.504 kg), SpO2 99.00%. General: Well developed, well nourished, in no acute distress.  Head: Normocephalic, atraumatic, sclera non-icteric, mucus membranes are moist,   Neck: Supple. Carotids are 2 + without bruits. No JVD  Lungs: Clear bilaterally to auscultation.  Heart: regular rate.  normal  S1 S2. No murmurs, gallops or rubs.  Abdomen: Soft, non-tender, non-distended with normal bowel sounds. No hepatomegaly. No rebound/guarding.  No masses.  Msk:  Strength and tone are normal  Extremities: No clubbing or cyanosis. No edema.  Distal pedal pulses are 2+ and equal bilaterally.  Neuro: Alert and oriented X 3. Moves all extremities spontaneously.  Psych:  Responds to questions appropriately with a normal affect.  ECG: September 25, 2012:   Sinus brady at 74.  No ST or T wave changes.  Assessment / Plan:

## 2012-10-11 ENCOUNTER — Other Ambulatory Visit: Payer: Self-pay

## 2012-10-11 ENCOUNTER — Encounter: Payer: Self-pay | Admitting: Cardiovascular Disease

## 2012-10-11 MED ORDER — AMLODIPINE BESYLATE 5 MG PO TABS: 5.0000 mg | ORAL_TABLET | Freq: Every day | ORAL | Status: DC

## 2012-10-21 ENCOUNTER — Other Ambulatory Visit: Payer: Self-pay

## 2012-10-25 ENCOUNTER — Encounter: Payer: Self-pay | Admitting: Cardiovascular Disease

## 2012-10-26 ENCOUNTER — Encounter: Payer: Self-pay | Admitting: Cardiovascular Disease

## 2012-12-31 ENCOUNTER — Encounter: Payer: Self-pay | Admitting: Cardiovascular Disease

## 2013-04-11 ENCOUNTER — Other Ambulatory Visit: Payer: Self-pay

## 2013-05-21 ENCOUNTER — Other Ambulatory Visit: Payer: Self-pay

## 2013-05-21 DIAGNOSIS — Z1231 Encounter for screening mammogram for malignant neoplasm of breast: Secondary | ICD-10-CM

## 2013-07-19 ENCOUNTER — Ambulatory Visit
Admission: RE | Admit: 2013-07-19 | Discharge: 2013-07-19 | Disposition: A | Discharge status: Home / Self Care | Payer: Medicare Other | Source: Ambulatory Visit

## 2013-07-19 DIAGNOSIS — Z1231 Encounter for screening mammogram for malignant neoplasm of breast: Secondary | ICD-10-CM

## 2013-07-23 ENCOUNTER — Other Ambulatory Visit: Payer: Self-pay | Admitting: Family Medicine

## 2013-07-23 DIAGNOSIS — R928 Other abnormal and inconclusive findings on diagnostic imaging of breast: Secondary | ICD-10-CM

## 2013-08-09 ENCOUNTER — Ambulatory Visit
Admission: RE | Admit: 2013-08-09 | Discharge: 2013-08-09 | Disposition: A | Discharge status: Home / Self Care | Payer: Medicare Other | Source: Ambulatory Visit | Attending: Family Medicine | Admitting: Family Medicine

## 2013-08-09 DIAGNOSIS — R928 Other abnormal and inconclusive findings on diagnostic imaging of breast: Secondary | ICD-10-CM

## 2013-09-27 ENCOUNTER — Other Ambulatory Visit: Payer: Self-pay | Admitting: *Deleted

## 2013-09-27 DIAGNOSIS — E785 Hyperlipidemia, unspecified: Secondary | ICD-10-CM

## 2013-11-12 ENCOUNTER — Ambulatory Visit: Payer: Medicare Other | Admitting: Cardiovascular Disease

## 2013-11-19 ENCOUNTER — Other Ambulatory Visit: Payer: Medicare Other

## 2013-11-22 ENCOUNTER — Ambulatory Visit: Payer: Medicare Other | Admitting: Cardiovascular Disease

## 2014-06-13 ENCOUNTER — Other Ambulatory Visit: Payer: Self-pay

## 2014-06-13 DIAGNOSIS — Z1239 Encounter for other screening for malignant neoplasm of breast: Secondary | ICD-10-CM

## 2014-07-02 ENCOUNTER — Ambulatory Visit (INDEPENDENT_AMBULATORY_CARE_PROVIDER_SITE_OTHER): Payer: Medicare Other | Admitting: Cardiovascular Disease

## 2014-07-02 ENCOUNTER — Encounter: Payer: Self-pay | Admitting: Cardiovascular Disease

## 2014-07-02 VITALS — BP 124/72 | HR 53 | Ht 63.0 in | Wt 144.8 lb

## 2014-07-02 DIAGNOSIS — E785 Hyperlipidemia, unspecified: Secondary | ICD-10-CM

## 2014-07-02 DIAGNOSIS — I10 Essential (primary) hypertension: Secondary | ICD-10-CM

## 2014-07-02 DIAGNOSIS — I2510 Atherosclerotic heart disease of native coronary artery without angina pectoris: Secondary | ICD-10-CM

## 2014-07-02 NOTE — Patient Instructions (Signed)
Your physician recommends that you continue on your current medications as directed. Please refer to the Current Medication list given to you today.  Your physician wants you to follow-up in: 1 year with Dr. Nahser.  You will receive a reminder letter in the mail two months in advance. If you don't receive a letter, please call our office to schedule the follow-up appointment.  

## 2014-07-02 NOTE — Assessment & Plan Note (Signed)
[name]  has done very well. She's not had any episodes of angina. Continue same medications.

## 2014-07-02 NOTE — Progress Notes (Signed)
[name] Date of Birth  20-Dec-1945       [facility] Office [address], [place], [place]  [address]   [place], [place]  [address] [phone]     [phone]   Fax  [phone]    Fax [phone]  Problem List: 1. Coronary artery disease-status post apical inferior myocardial infarction by cardiac catheterization-December, 2007 2. Hyperlipidemia 3. Hypertension  History of Present Illness:  [name] is a 68 yo with hx of CAD - she has done well for the past several years.   She has felt well.  Her BP has been well controlled.  She was in [place], [place] for the past several weeks working on her dad's house.  She has lost 5 pounds .   She has had some mild orthostasis over the past month.  September 25, 2012:  She has done well.. No chest pain or dyspnea.  She is exercising regularly and is eating well.    She has been losing weight.    Oct. 27, 2015:  [name] is doing well.     Current Outpatient Prescriptions on File Prior to Visit  Medication Sig Dispense Refill  . amLODipine (NORVASC) 5 MG tablet Take 1 tablet (5 mg total) by mouth daily.  90 tablet  3  . aspirin 81 MG tablet Take 81 mg by mouth daily.        . cetirizine (ZYRTEC) 10 MG tablet Take 10 mg by mouth daily.      . citalopram (CELEXA) 40 MG tablet Take 40 mg by mouth daily.        Marland Kitchen ESTRACE VAGINAL 0.1 MG/GM vaginal cream Place vaginally 3 (three) times a week.       . losartan (COZAAR) 100 MG tablet Take 1 tablet (100 mg total) by mouth daily.  90 tablet  1  . nitroGLYCERIN (NITROSTAT) 0.4 MG SL tablet Place 1 tablet (0.4 mg total) under the tongue every 5 (five) minutes as needed.  50 tablet  1  . simvastatin (ZOCOR) 20 MG tablet Take 20 mg by mouth daily.        No current facility-administered medications on file prior to visit.    Allergies  Allergen Reactions  . Plavix [Clopidogrel Bisulfate]     Past Medical History  Diagnosis Date  .  Coronary artery disease   . 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 Hyperlipidemia   . Hypertension   . Headache, acute   . Chest pain     Past Surgical History  Procedure Laterality Date  . Cardiac catheterization  08/12/2006    SINGLE VESSEL CAD    History  Smoking status  . Never Smoker   Smokeless tobacco  . Not on file    History  Alcohol Use No    No family history on file.  Reviw of Systems:  Reviewed in the HPI.  All other systems are negative.  Physical Exam: Blood pressure 124/72, pulse 53, height 5\' 3"  (1.6 m), weight 144 lb 12.8 oz (65.681 kg). General: Well developed, well nourished, in no acute distress.  Head: Normocephalic, atraumatic, sclera non-icteric, mucus membranes are moist,   Neck: Supple. Carotids are 2 + without bruits. No JVD  Lungs: Clear bilaterally to auscultation.  Heart: regular rate.  normal  S1 S2. No murmurs, gallops or rubs.  Abdomen: Soft, non-tender, non-distended with normal bowel sounds. No hepatomegaly. No  rebound/guarding. No masses.  Msk:  Strength and tone are normal  Extremities: No clubbing or cyanosis. No edema.  Distal pedal pulses are 2+ and equal bilaterally.  Neuro: Alert and oriented X 3. Moves all extremities spontaneously.  Psych:  Responds to questions appropriately with a normal affect.  ECG: Oct. 27,2015:  Sinus brady 53. Normal.   Assessment / Plan:

## 2014-07-02 NOTE — Assessment & Plan Note (Signed)
Lipids are checked by her medical doctor. Stable

## 2014-07-02 NOTE — Assessment & Plan Note (Signed)
BP is stable.

## 2014-07-22 ENCOUNTER — Other Ambulatory Visit: Payer: Self-pay

## 2014-07-22 DIAGNOSIS — Z1231 Encounter for screening mammogram for malignant neoplasm of breast: Secondary | ICD-10-CM

## 2014-07-24 ENCOUNTER — Ambulatory Visit
Admission: RE | Admit: 2014-07-24 | Discharge: 2014-07-24 | Disposition: A | Discharge status: Home / Self Care | Payer: Medicare Other | Source: Ambulatory Visit

## 2014-07-24 DIAGNOSIS — Z1231 Encounter for screening mammogram for malignant neoplasm of breast: Secondary | ICD-10-CM

## 2015-06-24 ENCOUNTER — Other Ambulatory Visit: Payer: Self-pay

## 2015-06-24 DIAGNOSIS — Z1231 Encounter for screening mammogram for malignant neoplasm of breast: Secondary | ICD-10-CM

## 2015-07-01 ENCOUNTER — Telehealth: Payer: Self-pay | Admitting: Cardiovascular Disease

## 2015-07-01 DIAGNOSIS — E785 Hyperlipidemia, unspecified: Secondary | ICD-10-CM

## 2015-07-01 NOTE — Telephone Encounter (Signed)
New problem    Pt want to know if a metabolic panel can be done with her labs. Please advise pt.

## 2015-07-01 NOTE — Telephone Encounter (Addendum)
Patient is scheduled to have labs on day of office visit 07/09/2015. Patient is requesting to have a BMET. Let me know if you want me to order and link to appointment. At this time no orders are linked to lab appointment.

## 2015-07-02 ENCOUNTER — Ambulatory Visit: Payer: Self-pay | Admitting: Cardiovascular Disease

## 2015-07-02 NOTE — Telephone Encounter (Signed)
Spoke with patient who states she saw her PCP last week and told him that she would like Dr. Acie Fredrickson to get the lab work.  I advised her that I have ordered labs requested by Dr. Acie Fredrickson and to come fasting to appointment next week.  She verbalized understanding and agreement and thanked me for the call.

## 2015-07-02 NOTE — Telephone Encounter (Signed)
She has a hx of CAD Lets get a BMP, lipids, liver

## 2015-07-08 ENCOUNTER — Encounter: Payer: Self-pay | Admitting: Cardiovascular Disease

## 2015-07-09 ENCOUNTER — Other Ambulatory Visit (INDEPENDENT_AMBULATORY_CARE_PROVIDER_SITE_OTHER): Payer: Medicare Other | Admitting: *Deleted

## 2015-07-09 ENCOUNTER — Encounter: Payer: Self-pay | Admitting: Cardiovascular Disease

## 2015-07-09 ENCOUNTER — Ambulatory Visit (INDEPENDENT_AMBULATORY_CARE_PROVIDER_SITE_OTHER): Payer: Medicare Other | Admitting: Cardiovascular Disease

## 2015-07-09 VITALS — BP 122/80 | HR 56 | Ht 63.0 in | Wt 136.0 lb

## 2015-07-09 DIAGNOSIS — E785 Hyperlipidemia, unspecified: Secondary | ICD-10-CM

## 2015-07-09 DIAGNOSIS — I10 Essential (primary) hypertension: Secondary | ICD-10-CM

## 2015-07-09 LAB — HEPATIC FUNCTION PANEL
ALT: 14 U/L (ref 6–29)
AST: 14 U/L (ref 10–35)
Albumin: 4 g/dL (ref 3.6–5.1)
Alkaline Phosphatase: 66 U/L (ref 33–130)
Bilirubin, Direct: 0.2 mg/dL (ref <=0.2)
Indirect Bilirubin: 0.3 mg/dL (ref 0.2–1.2)
Total Bilirubin: 0.5 mg/dL (ref 0.2–1.2)
Total Protein: 6.5 g/dL (ref 6.1–8.1)

## 2015-07-09 LAB — BASIC METABOLIC PANEL
BUN: 18 mg/dL (ref 7–25)
CO2: 28 mmol/L (ref 20–31)
Calcium: 8.8 mg/dL (ref 8.6–10.4)
Chloride: 103 mmol/L (ref 98–110)
Creat: 1.12 mg/dL — ABNORMAL HIGH (ref 0.60–0.93)
Glucose, Bld: 94 mg/dL (ref 65–99)
Potassium: 4.2 mmol/L (ref 3.5–5.3)
Sodium: 138 mmol/L (ref 135–146)

## 2015-07-09 LAB — LIPID PANEL
Cholesterol: 131 mg/dL (ref 125–200)
HDL: 50 mg/dL (ref >=46)
LDL Cholesterol: 68 mg/dL (ref <130)
Total CHOL/HDL Ratio: 2.6 Ratio (ref <=5.0)
Triglycerides: 63 mg/dL (ref <150)
VLDL: 13 mg/dL (ref <30)

## 2015-07-09 NOTE — Patient Instructions (Signed)
Medication Instructions:  None  Labwork: BMET, LFTs and Lipids at next appointment  Testing/Procedures: None  Follow-Up: Your physician wants you to follow-up in: 1 year with Dr. Acie Fredrickson. You will receive a reminder letter in the mail two months in advance. If you don't receive a letter, please call our office to schedule the follow-up appointment.   Any Other Special Instructions Will Be Listed Below (If Applicable).     If you need a refill on your cardiac medications before your next appointment, please call your pharmacy.

## 2015-07-09 NOTE — Progress Notes (Signed)
Satira Sark Date of Birth  [birth year]       [facility] Office [address], [place], [place]  [address]   [place],   [address] [phone]     [phone]   Fax  [phone]    Fax [phone]  Problem List: 1. Coronary artery disease-status post apical inferior myocardial infarction by cardiac catheterization-December, 2007 2. Hyperlipidemia 3. Hypertension  History of Present Illness:  [name] is a 69 yo with hx of CAD - she has done well for the past several years.   She has felt well.  Her BP has been well controlled.  She was in [place], [place] for the past several weeks working on her dad's house.  She has lost 5 pounds .   She has had some mild orthostasis over the past month.  September 25, 2012:  She has done well.. No chest pain or dyspnea.  She is exercising regularly and is eating well.    She has been losing weight.    Oct. 27, 2015:  [name] is doing well.    Nov. 2, 2016:  [name] is seen for follow-up of her coronary artery disease, hypertension, and hyperlipidemia. No CP , no dyspnea .  Takes BP and HR regularly . Not exercising much  Has had labs this am.   Are pending .    Current Outpatient Prescriptions on File Prior to Visit  Medication Sig Dispense Refill  . ALPRAZolam (XANAX) 0.5 MG tablet Take 0.5 mg by mouth at bedtime as needed for anxiety or sleep.     Marland Kitchen aspirin 81 MG tablet Take 81 mg by mouth daily.      . cetirizine (ZYRTEC) 10 MG tablet Take 10 mg by mouth daily.    . citalopram (CELEXA) 40 MG tablet Take 40 mg by mouth daily.      Marland Kitchen ESTRACE VAGINAL 0.1 MG/GM vaginal cream Place vaginally 3 (three) times a week.     . losartan (COZAAR) 100 MG tablet Take 1 tablet (100 mg total) by mouth daily. 90 tablet 1  . metoprolol succinate (TOPROL-XL) 50 MG 24 hr tablet Take 50 mg by mouth daily.     . nitroGLYCERIN (NITROSTAT) 0.4 MG SL tablet Place 0.4 mg under the tongue every 5 (five)  minutes as needed for chest pain (x 3 tabs daily).    . simvastatin (ZOCOR) 20 MG tablet Take 20 mg by mouth daily.      No current facility-administered medications on file prior to visit.    Allergies  Allergen Reactions  . Plavix [Clopidogrel Bisulfate]     unknown    Past Medical History  Diagnosis Date  . Coronary artery disease   . 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 Hyperlipidemia   . Hypertension   . Headache, acute   . Chest pain     Past Surgical History  Procedure Laterality Date  . Cardiac catheterization  08/12/2006    SINGLE VESSEL CAD    History  Smoking status  . Never Smoker   Smokeless tobacco  . Not on file    History  Alcohol Use No    Family History  Problem Relation Age of Onset  . Alzheimer's disease Mother   . Heart murmur Sister   . Cancer - Lung    . Healthy Sister     Reviw of Systems:  Reviewed in the HPI.  All other systems are negative.  Physical Exam: Blood pressure 122/80, pulse 56, height 5\' 3"  (1.6 m), weight 136 lb (61.689 kg). General: Well developed, well nourished, in no acute distress.  Head: Normocephalic, atraumatic, sclera non-icteric, mucus membranes are moist,   Neck: Supple. Carotids are 2 + without bruits. No JVD  Lungs: Clear bilaterally to auscultation.  Heart: regular rate.  normal  S1 S2. No murmurs, gallops or rubs.  Abdomen: Soft, non-tender, non-distended with normal bowel sounds. No hepatomegaly. No rebound/guarding. No masses.  Msk:  Strength and tone are normal  Extremities: No clubbing or cyanosis. No edema.  Distal pedal pulses are 2+ and equal bilaterally.  Neuro: Alert and oriented X 3. Moves all extremities spontaneously.  Psych:  Responds to questions appropriately with a normal affect.  ECG: Nov. 2, 2016 Sinus brady at 56,  1st degree AV block   Assessment / Plan:   1. Coronary artery disease-status post apical inferior myocardial infarction by cardiac catheterization-December,  2007 2. Hyperlipidemia 3. Hypertension    [name], Wonda Cheng, MD  07/09/2015 8:59 AM    [facility] [place],  [place], [place]  [address] Pager [phone] Phone: [phone]; Fax: [phone]   [facility]  [address] [place], [place]  [address] [phone]   Fax [phone]

## 2015-07-30 ENCOUNTER — Ambulatory Visit
Admission: RE | Admit: 2015-07-30 | Discharge: 2015-07-30 | Disposition: A | Discharge status: Home / Self Care | Payer: Medicare Other | Source: Ambulatory Visit

## 2015-07-30 DIAGNOSIS — Z1231 Encounter for screening mammogram for malignant neoplasm of breast: Secondary | ICD-10-CM

## 2015-09-07 HISTORY — PX: BREAST BIOPSY: SHX20

## 2016-06-29 ENCOUNTER — Encounter: Payer: Self-pay | Admitting: Cardiovascular Disease

## 2016-06-29 ENCOUNTER — Other Ambulatory Visit: Payer: Self-pay | Admitting: Family Medicine

## 2016-06-29 DIAGNOSIS — Z1231 Encounter for screening mammogram for malignant neoplasm of breast: Secondary | ICD-10-CM

## 2016-07-09 ENCOUNTER — Ambulatory Visit (INDEPENDENT_AMBULATORY_CARE_PROVIDER_SITE_OTHER): Payer: Medicare Other | Admitting: Cardiovascular Disease

## 2016-07-09 ENCOUNTER — Encounter: Payer: Self-pay | Admitting: Cardiovascular Disease

## 2016-07-09 VITALS — BP 130/84 | HR 59 | Ht 63.0 in | Wt 137.0 lb

## 2016-07-09 DIAGNOSIS — I25119 Atherosclerotic heart disease of native coronary artery with unspecified angina pectoris: Secondary | ICD-10-CM

## 2016-07-09 DIAGNOSIS — E782 Mixed hyperlipidemia: Secondary | ICD-10-CM | POA: Diagnosis not present

## 2016-07-09 DIAGNOSIS — I2510 Atherosclerotic heart disease of native coronary artery without angina pectoris: Secondary | ICD-10-CM

## 2016-07-09 DIAGNOSIS — I10 Essential (primary) hypertension: Secondary | ICD-10-CM

## 2016-07-09 MED ORDER — NITROGLYCERIN 0.4 MG SL SUBL: 0.4000 mg | SUBLINGUAL_TABLET | SUBLINGUAL | 6 refills | Status: DC | PRN

## 2016-07-09 NOTE — Patient Instructions (Signed)

## 2016-07-09 NOTE — Progress Notes (Signed)
[name] Date of Birth  1945/09/15       [facility] Office [address], [address], [place], [place]  [address]   [place], [place], [place]  [phone]     [phone]   Fax  [phone]    Fax [phone]  Problem List: 1. Coronary artery disease-status post apical inferior myocardial infarction by cardiac catheterization-December, 2007 2. Hyperlipidemia 3. Hypertension   Jackqulyn is a 70 yo with hx of CAD - she has done well for the past several years.   She has felt well.  Her BP has been well controlled.  She was in [place], [place] for the past several weeks working on her dad's house.  She has lost 5 pounds .   She has had some mild orthostasis over the past month.  September 25, 2012:  She has done well.. No chest pain or dyspnea.  She is exercising regularly and is eating well.    She has been losing weight.    Oct. 27, 2015:  Ailynn is doing well.    Nov. 2, 2016:  Alysia is seen for follow-up of her coronary artery disease, hypertension, and hyperlipidemia. No CP , no dyspnea .  Takes BP and HR regularly . Not exercising much  Has had labs this am.   Are pending .   Nov. 3, 2017:  Doing well. Water exercised 3 times a week  No CP ,   Very active    Current Outpatient Prescriptions on File Prior to Visit  Medication Sig Dispense Refill  . ALPRAZolam (XANAX) 0.5 MG tablet Take 0.5 mg by mouth at bedtime as needed for anxiety or sleep.     Marland Kitchen amLODipine (NORVASC) 5 MG tablet Take 5 mg by mouth daily.    Marland Kitchen aspirin 81 MG tablet Take 81 mg by mouth daily.      . cetirizine (ZYRTEC) 10 MG tablet Take 10 mg by mouth daily.    . citalopram (CELEXA) 40 MG tablet Take 40 mg by mouth daily.      Marland Kitchen ESTRACE VAGINAL 0.1 MG/GM vaginal cream Place vaginally 3 (three) times a week.     . losartan (COZAAR) 100 MG tablet Take 1 tablet (100 mg total) by mouth daily. 90 tablet 1  . metoprolol succinate (TOPROL-XL) 50 MG 24 hr tablet  Take 50 mg by mouth daily.     . nitroGLYCERIN (NITROSTAT) 0.4 MG SL tablet Place 0.4 mg under the tongue every 5 (five) minutes as needed for chest pain (x 3 tabs daily).    . simvastatin (ZOCOR) 20 MG tablet Take 20 mg by mouth daily.      No current facility-administered medications on file prior to visit.     Allergies  Allergen Reactions  . Plavix [Clopidogrel Bisulfate]     unknown    Past Medical History:  Diagnosis Date  . Chest pain   . Coronary artery disease   . Headache, acute   . Hyperlipidemia   . Hypertension   . MI, old    APICAL INFERIOR    Past Surgical History:  Procedure Laterality Date  . CARDIAC CATHETERIZATION  08/12/2006   SINGLE VESSEL CAD    History  Smoking Status  . Never Smoker  Smokeless Tobacco  . Never Used    History  Alcohol Use No    Family History  Problem Relation Age of Onset  . Alzheimer's disease Mother   .  Heart murmur Sister   . Cancer - Lung    . Healthy Sister     Reviw of Systems:  Reviewed in the HPI.  All other systems are negative.  Physical Exam: Blood pressure 130/84, pulse (!) 59, height 5\' 3"  (1.6 m), weight 137 lb (62.1 kg). General: Well developed, well nourished, in no acute distress.  Head: Normocephalic, atraumatic, sclera non-icteric, mucus membranes are moist,   Neck: Supple. Carotids are 2 + without bruits. No JVD  Lungs: Clear bilaterally to auscultation.  Heart: regular rate.  normal  S1 S2. No murmurs, gallops or rubs.  Abdomen: Soft, non-tender, non-distended with normal bowel sounds. No hepatomegaly. No rebound/guarding. No masses.  Msk:  Strength and tone are normal  Extremities: No clubbing or cyanosis. No edema.  Distal pedal pulses are 2+ and equal bilaterally.  Neuro: Alert and oriented X 3. Moves all extremities spontaneously.  Psych:  Responds to questions appropriately with a normal affect.  ECG: Nov. 3, 2017:   Sinus brady at 59.   No ST or T wave changes.    Assessment / Plan:   1. Coronary artery disease-status post apical inferior myocardial infarction by cardiac catheterization-December, 2007 2. Hyperlipidemia - continue Simva.   Lipids have been great. Will check today   3. Hypertension - BP has been great  Continue current medications  Mertie Moores, MD  07/09/2016 2:32 PM    [facility] [address],  [place], [place]  [address] Pager [phone] Phone: [phone]; Fax: [phone]

## 2016-07-10 LAB — COMPREHENSIVE METABOLIC PANEL
ALT: 21 U/L (ref 6–29)
AST: 21 U/L (ref 10–35)
Albumin: 4.2 g/dL (ref 3.6–5.1)
Alkaline Phosphatase: 81 U/L (ref 33–130)
BUN: 23 mg/dL (ref 7–25)
CO2: 23 mmol/L (ref 20–31)
Calcium: 9.1 mg/dL (ref 8.6–10.4)
Chloride: 104 mmol/L (ref 98–110)
Creat: 1 mg/dL — ABNORMAL HIGH (ref 0.60–0.93)
Glucose, Bld: 81 mg/dL (ref 65–99)
Potassium: 4.4 mmol/L (ref 3.5–5.3)
Sodium: 138 mmol/L (ref 135–146)
Total Bilirubin: 0.4 mg/dL (ref 0.2–1.2)
Total Protein: 6.6 g/dL (ref 6.1–8.1)

## 2016-07-10 LAB — LIPID PANEL
Cholesterol: 150 mg/dL (ref 125–200)
HDL: 51 mg/dL (ref >=46)
LDL Cholesterol: 78 mg/dL (ref <130)
Total CHOL/HDL Ratio: 2.9 Ratio (ref <=5.0)
Triglycerides: 104 mg/dL (ref <150)
VLDL: 21 mg/dL (ref <30)

## 2016-07-12 ENCOUNTER — Encounter: Payer: Self-pay | Admitting: Cardiovascular Disease

## 2016-07-16 ENCOUNTER — Encounter: Payer: Self-pay | Admitting: Cardiovascular Disease

## 2016-08-03 ENCOUNTER — Ambulatory Visit
Admission: RE | Admit: 2016-08-03 | Discharge: 2016-08-03 | Disposition: A | Discharge status: Home / Self Care | Payer: Medicare Other | Source: Ambulatory Visit | Attending: Family Medicine | Admitting: Family Medicine

## 2016-08-03 DIAGNOSIS — Z1231 Encounter for screening mammogram for malignant neoplasm of breast: Secondary | ICD-10-CM

## 2016-08-04 ENCOUNTER — Other Ambulatory Visit: Payer: Self-pay | Admitting: Family Medicine

## 2016-08-04 DIAGNOSIS — R928 Other abnormal and inconclusive findings on diagnostic imaging of breast: Secondary | ICD-10-CM

## 2016-08-11 ENCOUNTER — Ambulatory Visit
Admission: RE | Admit: 2016-08-11 | Discharge: 2016-08-11 | Disposition: A | Discharge status: Home / Self Care | Payer: Medicare Other | Source: Ambulatory Visit | Attending: Family Medicine | Admitting: Family Medicine

## 2016-08-11 ENCOUNTER — Other Ambulatory Visit: Payer: Self-pay | Admitting: Family Medicine

## 2016-08-11 DIAGNOSIS — N6489 Other specified disorders of breast: Secondary | ICD-10-CM

## 2016-08-11 DIAGNOSIS — R928 Other abnormal and inconclusive findings on diagnostic imaging of breast: Secondary | ICD-10-CM

## 2016-08-17 ENCOUNTER — Ambulatory Visit
Admission: RE | Admit: 2016-08-17 | Discharge: 2016-08-17 | Disposition: A | Discharge status: Home / Self Care | Payer: Medicare Other | Source: Ambulatory Visit | Attending: Family Medicine | Admitting: Family Medicine

## 2016-08-17 ENCOUNTER — Other Ambulatory Visit: Payer: Self-pay | Admitting: Family Medicine

## 2016-08-17 DIAGNOSIS — N6489 Other specified disorders of breast: Secondary | ICD-10-CM

## 2017-06-29 ENCOUNTER — Other Ambulatory Visit: Payer: Self-pay | Admitting: Family Medicine

## 2017-06-29 DIAGNOSIS — Z1231 Encounter for screening mammogram for malignant neoplasm of breast: Secondary | ICD-10-CM

## 2017-08-10 ENCOUNTER — Ambulatory Visit
Admission: RE | Admit: 2017-08-10 | Discharge: 2017-08-10 | Disposition: A | Discharge status: Home / Self Care | Payer: Medicare Other | Source: Ambulatory Visit | Attending: Family Medicine | Admitting: Family Medicine

## 2017-08-10 DIAGNOSIS — Z1231 Encounter for screening mammogram for malignant neoplasm of breast: Secondary | ICD-10-CM

## 2017-09-19 ENCOUNTER — Ambulatory Visit: Payer: Medicare Other | Admitting: Cardiovascular Disease

## 2017-09-23 ENCOUNTER — Ambulatory Visit: Payer: Medicare Other

## 2017-09-23 ENCOUNTER — Ambulatory Visit (HOSPITAL_COMMUNITY): Payer: Medicare Other

## 2017-09-23 ENCOUNTER — Ambulatory Visit
Admission: RE | Admit: 2017-09-23 | Discharge: 2017-09-23 | Disposition: A | Discharge status: Home / Self Care | Payer: Medicare Other | Source: Ambulatory Visit | Attending: Family Medicine | Admitting: Family Medicine

## 2017-09-23 ENCOUNTER — Other Ambulatory Visit: Payer: Self-pay | Admitting: Family Medicine

## 2017-09-23 DIAGNOSIS — R52 Pain, unspecified: Secondary | ICD-10-CM

## 2017-09-23 DIAGNOSIS — M25561 Pain in right knee: Secondary | ICD-10-CM

## 2018-06-30 ENCOUNTER — Other Ambulatory Visit: Payer: Self-pay | Admitting: Gastroenterology

## 2018-06-30 DIAGNOSIS — R1013 Epigastric pain: Secondary | ICD-10-CM

## 2018-06-30 DIAGNOSIS — Z8719 Personal history of other diseases of the digestive system: Secondary | ICD-10-CM

## 2018-07-05 ENCOUNTER — Ambulatory Visit
Admission: RE | Admit: 2018-07-05 | Discharge: 2018-07-05 | Disposition: A | Discharge status: Home / Self Care | Payer: Medicare Other | Source: Ambulatory Visit | Attending: Gastroenterology | Admitting: Gastroenterology

## 2018-07-05 ENCOUNTER — Other Ambulatory Visit: Payer: Medicare Other

## 2018-07-05 DIAGNOSIS — Z8719 Personal history of other diseases of the digestive system: Secondary | ICD-10-CM

## 2018-07-05 DIAGNOSIS — R1013 Epigastric pain: Secondary | ICD-10-CM

## 2018-07-05 MED ORDER — IOPAMIDOL (ISOVUE-300) INJECTION 61%
100.0000 mL | Freq: Once | INTRAVENOUS | Status: AC | PRN
  Administered 2018-07-05: 100 mL via INTRAVENOUS

## 2018-07-12 ENCOUNTER — Other Ambulatory Visit: Payer: Self-pay | Admitting: Family Medicine

## 2018-07-12 DIAGNOSIS — Z1231 Encounter for screening mammogram for malignant neoplasm of breast: Secondary | ICD-10-CM

## 2018-08-23 ENCOUNTER — Ambulatory Visit
Admission: RE | Admit: 2018-08-23 | Discharge: 2018-08-23 | Disposition: A | Discharge status: Home / Self Care | Payer: Medicare Other | Source: Ambulatory Visit | Attending: Family Medicine | Admitting: Family Medicine

## 2018-08-23 DIAGNOSIS — Z1231 Encounter for screening mammogram for malignant neoplasm of breast: Secondary | ICD-10-CM

## 2019-07-16 ENCOUNTER — Other Ambulatory Visit: Payer: Self-pay | Admitting: Family Medicine

## 2019-07-16 DIAGNOSIS — Z1231 Encounter for screening mammogram for malignant neoplasm of breast: Secondary | ICD-10-CM

## 2019-09-06 ENCOUNTER — Ambulatory Visit
Admission: RE | Admit: 2019-09-06 | Discharge: 2019-09-06 | Disposition: A | Discharge status: Home / Self Care | Payer: Medicare Other | Source: Ambulatory Visit | Attending: Family Medicine | Admitting: Family Medicine

## 2019-09-06 ENCOUNTER — Other Ambulatory Visit: Payer: Self-pay

## 2019-09-06 DIAGNOSIS — Z1231 Encounter for screening mammogram for malignant neoplasm of breast: Secondary | ICD-10-CM

## 2019-10-02 ENCOUNTER — Ambulatory Visit: Payer: Medicare Other

## 2020-07-15 ENCOUNTER — Other Ambulatory Visit: Payer: Self-pay | Admitting: Family Medicine

## 2020-07-15 DIAGNOSIS — Z1231 Encounter for screening mammogram for malignant neoplasm of breast: Secondary | ICD-10-CM

## 2020-08-26 ENCOUNTER — Ambulatory Visit (INDEPENDENT_AMBULATORY_CARE_PROVIDER_SITE_OTHER): Payer: Medicare Other | Admitting: Otolaryngology

## 2020-09-10 ENCOUNTER — Ambulatory Visit
Admission: RE | Admit: 2020-09-10 | Discharge: 2020-09-10 | Disposition: A | Discharge status: Home / Self Care | Payer: Medicare Other | Source: Ambulatory Visit | Attending: Family Medicine | Admitting: Family Medicine

## 2020-09-10 ENCOUNTER — Other Ambulatory Visit: Payer: Self-pay

## 2020-09-10 DIAGNOSIS — Z1231 Encounter for screening mammogram for malignant neoplasm of breast: Secondary | ICD-10-CM

## 2020-09-15 ENCOUNTER — Ambulatory Visit (INDEPENDENT_AMBULATORY_CARE_PROVIDER_SITE_OTHER): Payer: Medicare Other | Admitting: Otolaryngology

## 2020-09-15 ENCOUNTER — Encounter (INDEPENDENT_AMBULATORY_CARE_PROVIDER_SITE_OTHER): Payer: Self-pay | Admitting: Otolaryngology

## 2020-09-15 ENCOUNTER — Other Ambulatory Visit: Payer: Self-pay

## 2020-09-15 VITALS — Temp 97.9°F

## 2020-09-15 DIAGNOSIS — H6121 Impacted cerumen, right ear: Secondary | ICD-10-CM

## 2020-09-15 DIAGNOSIS — H903 Sensorineural hearing loss, bilateral: Secondary | ICD-10-CM

## 2020-09-15 NOTE — Progress Notes (Signed)
HPI: [name] is a 75 y.o. female who returns today for evaluation of occasional clicking in the right ear that comes and goes.  She has been having this intermittently for the past 6 weeks.  She brings with her hearing test from [facility] that demonstrated a mild to moderate downsloping SNHL in both ears which was equal.  SRT's were 20 DB bilaterally.  She had type A tympanograms bilaterally..  Past Medical History:  Diagnosis Date  . Chest pain   . Coronary artery disease   . Headache, acute   . Hyperlipidemia   . Hypertension   . MI, old    APICAL INFERIOR   Past Surgical History:  Procedure Laterality Date  . AUGMENTATION MAMMAPLASTY Bilateral   . BREAST BIOPSY Right 2017  . CARDIAC CATHETERIZATION  08/12/2006   SINGLE VESSEL CAD   Social History   Socioeconomic History  . Marital status: Married    Spouse name: Not on file  . Number of children: Not on file  . Years of education: Not on file  . Highest education level: Not on file  Occupational History  . Not on file  Tobacco Use  . Smoking status: Never Smoker  . Smokeless tobacco: Never Used  Substance and Sexual Activity  . Alcohol use: No  . Drug use: No  . Sexual activity: Not on file  Other Topics Concern  . Not on file  Social History Narrative  . Not on file   Social Determinants of Health   Financial Resource Strain: Not on file  Food Insecurity: Not on file  Transportation Needs: Not on file  Physical Activity: Not on file  Stress: Not on file  Social Connections: Not on file   Family History  Problem Relation Age of Onset  . Alzheimer's disease Mother   . Heart murmur Sister   . Cancer - Lung Other   . Healthy Sister   . Breast cancer Neg Hx    Allergies  Allergen Reactions  . Plavix [Clopidogrel Bisulfate]     unknown   Prior to Admission medications   Medication Sig Start Date End Date Taking? Authorizing Provider  ALPRAZolam Duanne Moron) 0.5 MG tablet Take 0.5 mg by mouth at  bedtime as needed for anxiety or sleep.  06/11/14   [provider]  amLODipine (NORVASC) 5 MG tablet Take 5 mg by mouth daily. 06/16/15   [provider]  aspirin 81 MG tablet Take 81 mg by mouth daily.      [provider]  cetirizine (ZYRTEC) 10 MG tablet Take 10 mg by mouth daily.    [provider]  Cholecalciferol (VITAMIN D) 2000 units CAPS Take 2,000 Units by mouth daily.    [provider]  citalopram (CELEXA) 40 MG tablet Take 40 mg by mouth daily.      [provider]  ESTRACE VAGINAL 0.1 MG/GM vaginal cream Place vaginally 3 (three) times a week.  07/29/11   [provider]  losartan (COZAAR) 100 MG tablet Take 1 tablet (100 mg total) by mouth daily. 06/28/11   Nahser, Wonda Cheng, MD  metoprolol succinate (TOPROL-XL) 50 MG 24 hr tablet Take 50 mg by mouth daily.  06/12/14   [provider]  nitroGLYCERIN (NITROSTAT) 0.4 MG SL tablet Place 1 tablet (0.4 mg total) under the tongue every 5 (five) minutes as needed for chest pain (x 3 tabs daily). 07/09/16   Nahser, Wonda Cheng, MD  Probiotic Product (PROBIOTIC PO) Take 1  capsule by mouth daily.    [provider]  simvastatin (ZOCOR) 20 MG tablet Take 20 mg by mouth daily.  07/29/11   [provider]  vitamin B-12 (CYANOCOBALAMIN) 1000 MCG tablet Take 1,000 mcg by mouth daily.    [provider]     Positive ROS: Otherwise negative  All other systems have been reviewed and were otherwise negative with the exception of those mentioned in the HPI and as above.  Physical Exam: Constitutional: Alert, well-appearing, no acute distress Ears: External ears without lesions or tenderness.  She had a little bit of nonobstructing wax in the right ear canal with some hairs adjacent to the wax that was removed with a curette in the office today.  Left ear canal was clear.  TMs are clear otherwise with good mobility on pneumatic otoscopy. Nasal: External nose  without lesions.. Clear nasal passages Oral: Lips and gums without lesions. Tongue and palate mucosa without lesions. Posterior oropharynx clear. Neck: No palpable adenopathy or masses Respiratory: Breathing comfortably  Skin: No facial/neck lesions or rash noted.  Cerumen impaction removal  Date/Time: 09/15/2020 3:20 PM Performed by: Rozetta Nunnery, MD Authorized by: Rozetta Nunnery, MD   Consent:    Consent obtained:  Verbal   Consent given by:  Patient   Risks discussed:  Pain and bleeding Procedure details:    Location:  R ear   Procedure type: curette   Post-procedure details:    Inspection:  TM intact and canal normal   Hearing quality:  Improved   Patient tolerance of procedure:  Tolerated well, no immediate complications Comments:     TMs are clear bilaterally.    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 Questionable etiology of popping clicking in the right ear as this may be related to the wax and hairs within the ear canal.  Both TMs are clear.  Plan: Ear canals were cleaned in the office today. Recommended obtaining another hearing test in 1 to 2 years. Plus minus hearing aid recommendation.   Radene Journey, MD

## 2020-09-16 ENCOUNTER — Encounter (INDEPENDENT_AMBULATORY_CARE_PROVIDER_SITE_OTHER): Payer: Self-pay

## 2020-09-18 ENCOUNTER — Ambulatory Visit (INDEPENDENT_AMBULATORY_CARE_PROVIDER_SITE_OTHER): Payer: Self-pay | Admitting: Plastic Surgery

## 2020-09-18 ENCOUNTER — Other Ambulatory Visit: Payer: Self-pay

## 2020-09-18 VITALS — BP 142/79

## 2020-09-18 DIAGNOSIS — Z411 Encounter for cosmetic surgery: Secondary | ICD-10-CM

## 2020-09-18 NOTE — Progress Notes (Signed)
Referring Provider Sandra Frees, MD [name],  [place] [address]   CC:  Chief Complaint  Patient presents with  . Botulinum Toxin Injection      [name] is an 75 y.o. female.  HPI: Patient presents to discuss cosmetic concerns.  She is interested in continuing Botox treatment of her glabella and crows feet.  She has seen Dr. Dessie Russell for this in the past.  She is uncertain the amount of Botox she is gotten but she reports being satisfied with it in the past.  She normally keeps her forehead covered with her hair for the most part and so she is not interested in treatment of that area.  She distally wants to discuss a mini facelift.  She has had 1 before by Dr. Dessie Russell in 2013.  She felt like initially there was a nice improvement but the result has lessened over time.  She is not interested in going the operating room and does not want a drastic change but is interested in improving the area of her jowls more specifically.  Allergies  Allergen Reactions  . Plavix [Clopidogrel Bisulfate]     unknown    Outpatient Encounter Medications as of 09/18/2020  Medication Sig Note  . ALPRAZolam (XANAX) 0.5 MG tablet Take 0.5 mg by mouth at bedtime as needed for anxiety or sleep.  07/02/2014: Received from: External Pharmacy  . amLODipine (NORVASC) 5 MG tablet Take 5 mg by mouth daily. 07/09/2015: Received from: External Pharmacy Received Sig:   . aspirin 81 MG tablet Take 81 mg by mouth daily.     . cetirizine (ZYRTEC) 10 MG tablet Take 10 mg by mouth daily.   . Cholecalciferol (VITAMIN D) 2000 units CAPS Take 2,000 Units by mouth daily.   . citalopram (CELEXA) 40 MG tablet Take 40 mg by mouth daily.     Marland Kitchen ESTRACE VAGINAL 0.1 MG/GM vaginal cream Place vaginally 3 (three) times a week.    . losartan (COZAAR) 100 MG tablet Take 1 tablet (100 mg total) by mouth daily.   . metoprolol succinate (TOPROL-XL) 50 MG 24 hr tablet Take 50 mg by mouth daily.   07/02/2014: Received from: External Pharmacy  . nitroGLYCERIN (NITROSTAT) 0.4 MG SL tablet Place 1 tablet (0.4 mg total) under the tongue every 5 (five) minutes as needed for chest pain (x 3 tabs daily).   . Probiotic Product (PROBIOTIC PO) Take 1 capsule by mouth daily.   . simvastatin (ZOCOR) 20 MG tablet Take 20 mg by mouth daily.    . vitamin B-12 (CYANOCOBALAMIN) 1000 MCG tablet Take 1,000 mcg by mouth daily.    No facility-administered encounter medications on file as of 09/18/2020.     Past Medical History:  Diagnosis Date  . Chest pain   . Coronary artery disease   . Headache, acute   . Hyperlipidemia   . Hypertension   . MI, old    APICAL INFERIOR    Past Surgical History:  Procedure Laterality Date  . AUGMENTATION MAMMAPLASTY Bilateral   . BREAST BIOPSY Right 2017  . CARDIAC CATHETERIZATION  08/12/2006   SINGLE VESSEL CAD    Family History  Problem Relation Age of Onset  . Alzheimer's disease Mother   . Heart murmur Sister   . Cancer - Lung Other   . Healthy Sister   . Breast cancer Neg Hx     Social History   Social History Narrative  . Not on file  Denies tobacco use  Review of Systems General: Denies fevers, chills, weight loss CV: Denies chest pain, shortness of breath, palpitations  Physical Exam Vitals with BMI 09/18/2020 07/09/2016 07/09/2015  Height - 5\' 3"  5\' 3"   Weight - 137 lbs 136 lbs  BMI - 59.9 35.7  Systolic 017 793 903  Diastolic 79 84 80  Pulse - 59 56    General:  No acute distress,  Alert and oriented, Non-Toxic, Normal speech and affect Examination shows static and dynamic lines in the glabella and crows feet.  Her forehead does not animate dramatically so there is mild lines in that area.  She does have mild to moderate gelling and some skin and soft tissue laxity in the neck.  I can see the facelift scar extending in the pre and postauricular sulcus.  Cranial nerves appear grossly intact.  Assessment/Plan Patient is a good  candidate for neuromodulator treatment.  We discussed the risks and benefits of Botox and she is fully understanding.  The forehead, glabella and crows feet were prepped with a alcohol pad and 30 units of Botox were distributed between the glabella and crows feet.  She tolerated this fine.  I did offer to see her again in 2 weeks to do any touchups if necessary and she will consider that.  Otherwise we will see her at her next visit.  Regarding a mini facelift I do think she is a reasonable candidate.  I explained the limitations primarily being inability to address the neck.  Her neck is not a significant concern for her and I think doing it to target the jowls is appropriate and reasonable.  Discussed the risk of the procedure that include bleeding, infection, damage to surrounding structures and need for additional procedures.  We discussed the location and orientation of the scars which would be similar to what she has had done previously and she is familiar with that.  We will plan to provide a quote for her for this procedure and go from there.  [name] 09/18/2020, 5:56 PM

## 2020-09-23 DIAGNOSIS — Z719 Counseling, unspecified: Secondary | ICD-10-CM

## 2020-10-13 ENCOUNTER — Telehealth: Payer: Self-pay

## 2020-10-13 NOTE — Telephone Encounter (Signed)
Patient has the following questions for [facility] regarding her upcoming mini face lift with Dr. Claudia Desanctis:  - Patient would like to know exactly what the procedure entails. - Patient would like to know how the deadening is done. - Patient would like to know how long the procedure will take. - Patient would like to know how long her recovery time will be.  Please call.

## 2020-10-20 ENCOUNTER — Other Ambulatory Visit: Payer: Self-pay | Admitting: Family Medicine

## 2020-10-20 DIAGNOSIS — M8580 Other specified disorders of bone density and structure, unspecified site: Secondary | ICD-10-CM

## 2020-10-24 ENCOUNTER — Other Ambulatory Visit: Payer: Medicare Other

## 2020-11-26 ENCOUNTER — Ambulatory Visit (INDEPENDENT_AMBULATORY_CARE_PROVIDER_SITE_OTHER): Payer: Self-pay | Admitting: Plastic Surgery

## 2020-11-26 ENCOUNTER — Other Ambulatory Visit: Payer: Self-pay

## 2020-11-26 ENCOUNTER — Encounter: Payer: Self-pay | Admitting: Plastic Surgery

## 2020-11-26 VITALS — BP 120/74 | HR 69

## 2020-11-26 DIAGNOSIS — Z411 Encounter for cosmetic surgery: Secondary | ICD-10-CM

## 2020-11-26 MED ORDER — HYDROCODONE-ACETAMINOPHEN 5-325 MG PO TABS: 1.0000 | ORAL_TABLET | Freq: Four times a day (QID) | ORAL | 0 refills | Status: DC | PRN

## 2020-11-26 NOTE — Progress Notes (Signed)
Operative Note   DATE OF OPERATION: 11/26/2020  LOCATION:    SURGICAL DEPARTMENT: Plastic Surgery  PREOPERATIVE DIAGNOSES: Cosmetic  POSTOPERATIVE DIAGNOSES:  same  PROCEDURE:  1. Mini facelift  SURGEON: Talmadge Coventry, MD  Assistant: [place], RNFA  ANESTHESIA:  Local  COMPLICATIONS: None.   INDICATIONS FOR PROCEDURE:  The patient, [name] is a 75 y.o. 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 Risks, benefits and alternatives were fully discussed. Specific risks including but not limited to bleeding, infection, hematoma, seroma, scarring, pain, infection, wound healing problems, and need for further surgery were all discussed. The patient did have an ample opportunity to have questions answered to satisfaction.   DESCRIPTION OF PROCEDURE:  Local anesthesia was administered. The patient's operative site was prepped and draped in a sterile fashion. A time out was performed and all information was confirmed to be correct.  I started by marking out her previous scar.  This was a pretragal scar that extended anteriorly and posterior along the posterior auricular sulcus.  I then marked out the angle of the mandible going inferiorly from the earlobe and marked out 2 cm which was the anticipated amount of advancement.  I then infiltrated lidocaine with epinephrine that was diluted with bicarb and saline throughout the area to be undermined and along all incisions.  This was given plenty of time to work and a total of 50 cc of volume was injected on each side.  At this point the incisions were made with a 15 blade.  Undermining was performed in the subcutaneous plane.  There was some scarring particularly behind the ear on both sides.  The skin flap elevation was started with a knife and then progressed to tenotomies and then longer  scissors.  At this point a limited SMAS plication was performed with a running 3-0 PDS suture taking care to bury the knot.  This was done in a looped fashion along the preauricular area extending down towards the angle of the mandible and just inferior to this.  This was tied to give a similar amount of tension on both sides.  At this point the skin was able to be advanced under minimal tension to the full 2 cm advancement that I had planned.  The skin was then tailor tacked and secured with a combination of 4-0 Monocryl sutures and running 5-0 fast gut.  Meticulous hemostasis was obtained prior to closure with Bovie electrocautery.  This gave a great on table result.  She was then wrapped with a compressive chinstrap and provided with ice packs.   The patient tolerated the procedure well.  There were no complications.

## 2020-11-27 ENCOUNTER — Telehealth: Payer: Self-pay

## 2020-11-27 NOTE — Telephone Encounter (Signed)
11/24/20- call to pt regarding questions about her upcoming procedure with Dr. Claudia Desanctis.   I consulted with Dr. Claudia Desanctis about her questions & then I provided her with the information she was seeking. Pt voices understanding of the procedure & aftercare instructions. She is reminded to call for any concerns.

## 2020-12-10 ENCOUNTER — Other Ambulatory Visit: Payer: Self-pay

## 2020-12-10 ENCOUNTER — Ambulatory Visit (INDEPENDENT_AMBULATORY_CARE_PROVIDER_SITE_OTHER): Payer: Self-pay | Admitting: Plastic Surgery

## 2020-12-10 DIAGNOSIS — Z411 Encounter for cosmetic surgery: Secondary | ICD-10-CM

## 2020-12-10 DIAGNOSIS — Z719 Counseling, unspecified: Secondary | ICD-10-CM

## 2020-12-10 NOTE — Progress Notes (Signed)
Patient presents 2 weeks postop from mini facelift done in the office.  She is thrilled with her result and is very happy.  On exam everything looks to be healing well.  The remaining Monocryl sutures were removed.  She has no bruising at this point and very minimal swelling.  She does look to have a nice correction in the lower third of her face from the procedure.  We went over using scar cream and avoiding sun exposure on the scars.  I plan to see her again in 4 to 6 weeks.  All of her questions were answered.

## 2021-01-20 DIAGNOSIS — D2239 Melanocytic nevi of other parts of face: Secondary | ICD-10-CM | POA: Diagnosis not present

## 2021-01-20 DIAGNOSIS — D2261 Melanocytic nevi of right upper limb, including shoulder: Secondary | ICD-10-CM | POA: Diagnosis not present

## 2021-01-20 DIAGNOSIS — D1801 Hemangioma of skin and subcutaneous tissue: Secondary | ICD-10-CM | POA: Diagnosis not present

## 2021-01-20 DIAGNOSIS — L300 Nummular dermatitis: Secondary | ICD-10-CM | POA: Diagnosis not present

## 2021-01-20 DIAGNOSIS — L570 Actinic keratosis: Secondary | ICD-10-CM | POA: Diagnosis not present

## 2021-01-20 DIAGNOSIS — D225 Melanocytic nevi of trunk: Secondary | ICD-10-CM | POA: Diagnosis not present

## 2021-01-20 DIAGNOSIS — D485 Neoplasm of uncertain behavior of skin: Secondary | ICD-10-CM | POA: Diagnosis not present

## 2021-01-20 DIAGNOSIS — L814 Other melanin hyperpigmentation: Secondary | ICD-10-CM | POA: Diagnosis not present

## 2021-01-20 DIAGNOSIS — L820 Inflamed seborrheic keratosis: Secondary | ICD-10-CM | POA: Diagnosis not present

## 2021-01-22 ENCOUNTER — Ambulatory Visit: Payer: Medicare Other | Admitting: Plastic Surgery

## 2021-01-29 ENCOUNTER — Ambulatory Visit: Payer: Medicare Other | Admitting: Plastic Surgery

## 2021-02-17 DIAGNOSIS — I2510 Atherosclerotic heart disease of native coronary artery without angina pectoris: Secondary | ICD-10-CM | POA: Diagnosis not present

## 2021-02-17 DIAGNOSIS — I10 Essential (primary) hypertension: Secondary | ICD-10-CM | POA: Diagnosis not present

## 2021-02-17 DIAGNOSIS — E7800 Pure hypercholesterolemia, unspecified: Secondary | ICD-10-CM | POA: Diagnosis not present

## 2021-02-17 DIAGNOSIS — R7303 Prediabetes: Secondary | ICD-10-CM | POA: Diagnosis not present

## 2021-02-17 DIAGNOSIS — Z0000 Encounter for general adult medical examination without abnormal findings: Secondary | ICD-10-CM | POA: Diagnosis not present

## 2021-02-17 DIAGNOSIS — R7309 Other abnormal glucose: Secondary | ICD-10-CM | POA: Diagnosis not present

## 2021-02-17 DIAGNOSIS — I499 Cardiac arrhythmia, unspecified: Secondary | ICD-10-CM | POA: Diagnosis not present

## 2021-02-17 DIAGNOSIS — N183 Chronic kidney disease, stage 3 unspecified: Secondary | ICD-10-CM | POA: Diagnosis not present

## 2021-02-19 ENCOUNTER — Ambulatory Visit (INDEPENDENT_AMBULATORY_CARE_PROVIDER_SITE_OTHER): Payer: Self-pay | Admitting: Plastic Surgery

## 2021-02-19 ENCOUNTER — Other Ambulatory Visit: Payer: Self-pay

## 2021-02-19 ENCOUNTER — Encounter: Payer: Self-pay | Admitting: Plastic Surgery

## 2021-02-19 DIAGNOSIS — Z411 Encounter for cosmetic surgery: Secondary | ICD-10-CM

## 2021-02-19 NOTE — Progress Notes (Signed)
Patient presents a couple months postop from mini facelift.  She is overall very happy with the results.  The scars are fading and her correction of the lower face looks very natural yet with notable improvement.  She did want to discuss additional Botox treatment.  We did 30 units last time to the forehead, glabella and crows feet and she is interested in trying a little bit less this time around.  The forehead, glabella and crows feet were prepped with an alcohol pad and 25 units of Botox were distributed throughout.  She tolerated this fine.  We will plan to see her at her next visit and take a longer term postop photograph of the mini facelift.  All her questions were answered.

## 2021-02-26 DIAGNOSIS — R238 Other skin changes: Secondary | ICD-10-CM | POA: Diagnosis not present

## 2021-02-26 DIAGNOSIS — L728 Other follicular cysts of the skin and subcutaneous tissue: Secondary | ICD-10-CM | POA: Diagnosis not present

## 2021-02-26 DIAGNOSIS — D2261 Melanocytic nevi of right upper limb, including shoulder: Secondary | ICD-10-CM | POA: Diagnosis not present

## 2021-02-26 DIAGNOSIS — L905 Scar conditions and fibrosis of skin: Secondary | ICD-10-CM | POA: Diagnosis not present

## 2021-02-26 DIAGNOSIS — L820 Inflamed seborrheic keratosis: Secondary | ICD-10-CM | POA: Diagnosis not present

## 2021-03-26 ENCOUNTER — Ambulatory Visit
Admission: RE | Admit: 2021-03-26 | Discharge: 2021-03-26 | Disposition: A | Discharge status: Home / Self Care | Payer: Medicare Other | Source: Ambulatory Visit | Attending: Family Medicine | Admitting: Family Medicine

## 2021-03-26 ENCOUNTER — Other Ambulatory Visit: Payer: Self-pay

## 2021-03-26 DIAGNOSIS — M8580 Other specified disorders of bone density and structure, unspecified site: Secondary | ICD-10-CM

## 2021-03-26 DIAGNOSIS — Z780 Asymptomatic menopausal state: Secondary | ICD-10-CM | POA: Diagnosis not present

## 2021-03-26 DIAGNOSIS — M8589 Other specified disorders of bone density and structure, multiple sites: Secondary | ICD-10-CM | POA: Diagnosis not present

## 2021-06-01 DIAGNOSIS — K863 Pseudocyst of pancreas: Secondary | ICD-10-CM | POA: Diagnosis not present

## 2021-06-11 ENCOUNTER — Other Ambulatory Visit: Payer: Self-pay | Admitting: Surgical

## 2021-06-11 ENCOUNTER — Other Ambulatory Visit (HOSPITAL_COMMUNITY): Payer: Self-pay

## 2021-06-11 MED ORDER — LIDOCAINE 23% - TETRACAINE 7% TOPICAL OINTMENT (PLASTICIZED): 1.0000 "application " | TOPICAL_OINTMENT | Freq: Once | CUTANEOUS | 0 refills | Status: DC

## 2021-06-11 MED ORDER — LIDOCAINE 23% - TETRACAINE 7% TOPICAL OINTMENT (PLASTICIZED)
1.0000 "application " | TOPICAL_OINTMENT | Freq: Once | CUTANEOUS | 0 refills | Status: AC
  Filled 2021-06-11: qty 60, 5d supply, fill #0
  Filled 2021-07-07: qty 60, 1d supply, fill #0

## 2021-06-24 ENCOUNTER — Ambulatory Visit (INDEPENDENT_AMBULATORY_CARE_PROVIDER_SITE_OTHER): Payer: Self-pay | Admitting: Plastic Surgery

## 2021-06-24 ENCOUNTER — Other Ambulatory Visit: Payer: Self-pay

## 2021-06-24 ENCOUNTER — Other Ambulatory Visit (HOSPITAL_COMMUNITY): Payer: Self-pay

## 2021-06-24 DIAGNOSIS — Z411 Encounter for cosmetic surgery: Secondary | ICD-10-CM

## 2021-06-24 NOTE — Progress Notes (Signed)
Patient presents to discuss Botox treatment.  Last time we did 25 units between the forehead, glabella and crows feet.  She seems to prefer this smaller amount.  We reviewed the risks and benefits and she is interested in moving forward.  The forehead, glabella and crows feet were prepped with an alcohol pad.  25 units of Botox were distributed throughout.  She tolerated this fine.  We will plan to see her at her next visit.  All of her questions were answered.

## 2021-06-29 DIAGNOSIS — U071 COVID-19: Secondary | ICD-10-CM | POA: Diagnosis not present

## 2021-06-29 DIAGNOSIS — R059 Cough, unspecified: Secondary | ICD-10-CM | POA: Diagnosis not present

## 2021-06-29 DIAGNOSIS — R0981 Nasal congestion: Secondary | ICD-10-CM | POA: Diagnosis not present

## 2021-07-07 ENCOUNTER — Other Ambulatory Visit (HOSPITAL_COMMUNITY): Payer: Self-pay

## 2021-07-14 DIAGNOSIS — E119 Type 2 diabetes mellitus without complications: Secondary | ICD-10-CM | POA: Diagnosis not present

## 2021-07-15 ENCOUNTER — Other Ambulatory Visit (HOSPITAL_COMMUNITY): Payer: Self-pay

## 2021-07-29 ENCOUNTER — Other Ambulatory Visit: Payer: Self-pay

## 2021-07-29 ENCOUNTER — Ambulatory Visit (INDEPENDENT_AMBULATORY_CARE_PROVIDER_SITE_OTHER): Payer: Self-pay | Admitting: Surgical

## 2021-07-29 DIAGNOSIS — Z411 Encounter for cosmetic surgery: Secondary | ICD-10-CM

## 2021-07-29 NOTE — Progress Notes (Signed)
HALO Treatment   Treatment  Settings:       1470 nm : 350 microns @ 30 %     2940 nm : 30 microns @ 20 %      Zone  Area (cm2) [employer] (Joules) Delivered Energy (J) 1470 Depth (Micron) 1470 Density (%) 2940 Depth (M) 2940 Density (%)  1 65 880 940 350 25 30 21.2  2 65 880 911 350 31.1 30 20.7  3 36 487 500 350 30.8 30 20.5  4 36 487 494 350 30.4 30 20.3  5  Neck: 6  7 8   19 19  108   264 264 130   308 319 350   350 350 36   35 36.3 30   30 30 24    23.3 24.2      Topical and/or Block: Topical  Post Care: Patient is aware of post care instructions, all of her questions were answered in guards this.  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  Notes: Patient tolerated the procedure well.  There were no complications.

## 2021-08-03 ENCOUNTER — Other Ambulatory Visit: Payer: Self-pay | Admitting: Family Medicine

## 2021-08-03 DIAGNOSIS — Z1231 Encounter for screening mammogram for malignant neoplasm of breast: Secondary | ICD-10-CM

## 2021-08-12 DIAGNOSIS — Z961 Presence of intraocular lens: Secondary | ICD-10-CM | POA: Diagnosis not present

## 2021-08-12 DIAGNOSIS — H18413 Arcus senilis, bilateral: Secondary | ICD-10-CM | POA: Diagnosis not present

## 2021-08-12 DIAGNOSIS — H2512 Age-related nuclear cataract, left eye: Secondary | ICD-10-CM | POA: Diagnosis not present

## 2021-08-12 DIAGNOSIS — H40013 Open angle with borderline findings, low risk, bilateral: Secondary | ICD-10-CM | POA: Diagnosis not present

## 2021-08-12 DIAGNOSIS — H26491 Other secondary cataract, right eye: Secondary | ICD-10-CM | POA: Diagnosis not present

## 2021-08-13 DIAGNOSIS — R7303 Prediabetes: Secondary | ICD-10-CM | POA: Diagnosis not present

## 2021-08-13 DIAGNOSIS — I10 Essential (primary) hypertension: Secondary | ICD-10-CM | POA: Diagnosis not present

## 2021-08-13 DIAGNOSIS — H6981 Other specified disorders of Eustachian tube, right ear: Secondary | ICD-10-CM | POA: Diagnosis not present

## 2021-08-13 DIAGNOSIS — E7800 Pure hypercholesterolemia, unspecified: Secondary | ICD-10-CM | POA: Diagnosis not present

## 2021-08-13 DIAGNOSIS — N183 Chronic kidney disease, stage 3 unspecified: Secondary | ICD-10-CM | POA: Diagnosis not present

## 2021-08-13 DIAGNOSIS — I2510 Atherosclerotic heart disease of native coronary artery without angina pectoris: Secondary | ICD-10-CM | POA: Diagnosis not present

## 2021-08-19 DIAGNOSIS — H40013 Open angle with borderline findings, low risk, bilateral: Secondary | ICD-10-CM | POA: Diagnosis not present

## 2021-09-22 ENCOUNTER — Ambulatory Visit: Payer: Medicare Other | Admitting: Surgical

## 2021-09-23 ENCOUNTER — Ambulatory Visit
Admission: RE | Admit: 2021-09-23 | Discharge: 2021-09-23 | Disposition: A | Discharge status: Home / Self Care | Payer: Medicare Other | Source: Ambulatory Visit | Attending: Family Medicine | Admitting: Family Medicine

## 2021-09-23 DIAGNOSIS — Z1231 Encounter for screening mammogram for malignant neoplasm of breast: Secondary | ICD-10-CM | POA: Diagnosis not present

## 2021-09-29 DIAGNOSIS — K5730 Diverticulosis of large intestine without perforation or abscess without bleeding: Secondary | ICD-10-CM | POA: Diagnosis not present

## 2021-09-29 DIAGNOSIS — Z1211 Encounter for screening for malignant neoplasm of colon: Secondary | ICD-10-CM | POA: Diagnosis not present

## 2021-09-29 DIAGNOSIS — K648 Other hemorrhoids: Secondary | ICD-10-CM | POA: Diagnosis not present

## 2021-09-29 DIAGNOSIS — D124 Benign neoplasm of descending colon: Secondary | ICD-10-CM | POA: Diagnosis not present

## 2021-09-29 DIAGNOSIS — K644 Residual hemorrhoidal skin tags: Secondary | ICD-10-CM | POA: Diagnosis not present

## 2021-10-02 DIAGNOSIS — D124 Benign neoplasm of descending colon: Secondary | ICD-10-CM | POA: Diagnosis not present

## 2021-12-02 DIAGNOSIS — H40013 Open angle with borderline findings, low risk, bilateral: Secondary | ICD-10-CM | POA: Diagnosis not present

## 2022-01-26 DIAGNOSIS — I2510 Atherosclerotic heart disease of native coronary artery without angina pectoris: Secondary | ICD-10-CM | POA: Diagnosis not present

## 2022-01-26 DIAGNOSIS — I10 Essential (primary) hypertension: Secondary | ICD-10-CM | POA: Diagnosis not present

## 2022-01-26 DIAGNOSIS — R7303 Prediabetes: Secondary | ICD-10-CM | POA: Diagnosis not present

## 2022-01-26 DIAGNOSIS — E7800 Pure hypercholesterolemia, unspecified: Secondary | ICD-10-CM | POA: Diagnosis not present

## 2022-03-18 DIAGNOSIS — M25562 Pain in left knee: Secondary | ICD-10-CM | POA: Diagnosis not present

## 2022-04-07 DIAGNOSIS — M25562 Pain in left knee: Secondary | ICD-10-CM | POA: Diagnosis not present

## 2022-04-16 DIAGNOSIS — N183 Chronic kidney disease, stage 3 unspecified: Secondary | ICD-10-CM | POA: Diagnosis not present

## 2022-04-16 DIAGNOSIS — R7303 Prediabetes: Secondary | ICD-10-CM | POA: Diagnosis not present

## 2022-04-16 DIAGNOSIS — I2510 Atherosclerotic heart disease of native coronary artery without angina pectoris: Secondary | ICD-10-CM | POA: Diagnosis not present

## 2022-04-16 DIAGNOSIS — E7800 Pure hypercholesterolemia, unspecified: Secondary | ICD-10-CM | POA: Diagnosis not present

## 2022-04-16 DIAGNOSIS — I10 Essential (primary) hypertension: Secondary | ICD-10-CM | POA: Diagnosis not present

## 2022-04-16 DIAGNOSIS — Z0000 Encounter for general adult medical examination without abnormal findings: Secondary | ICD-10-CM | POA: Diagnosis not present

## 2022-06-15 DIAGNOSIS — Z23 Encounter for immunization: Secondary | ICD-10-CM | POA: Diagnosis not present

## 2022-06-15 DIAGNOSIS — I10 Essential (primary) hypertension: Secondary | ICD-10-CM | POA: Diagnosis not present

## 2022-06-15 DIAGNOSIS — B309 Viral conjunctivitis, unspecified: Secondary | ICD-10-CM | POA: Diagnosis not present

## 2022-07-21 DIAGNOSIS — E119 Type 2 diabetes mellitus without complications: Secondary | ICD-10-CM | POA: Diagnosis not present

## 2022-07-23 DIAGNOSIS — R7303 Prediabetes: Secondary | ICD-10-CM | POA: Diagnosis not present

## 2022-07-23 DIAGNOSIS — N183 Chronic kidney disease, stage 3 unspecified: Secondary | ICD-10-CM | POA: Diagnosis not present

## 2022-08-10 ENCOUNTER — Other Ambulatory Visit: Payer: Self-pay | Admitting: Family Medicine

## 2022-08-10 DIAGNOSIS — Z1231 Encounter for screening mammogram for malignant neoplasm of breast: Secondary | ICD-10-CM

## 2022-10-04 ENCOUNTER — Ambulatory Visit
Admission: RE | Admit: 2022-10-04 | Discharge: 2022-10-04 | Disposition: A | Discharge status: Home / Self Care | Payer: Medicare Other | Source: Ambulatory Visit

## 2022-10-04 DIAGNOSIS — Z1231 Encounter for screening mammogram for malignant neoplasm of breast: Secondary | ICD-10-CM | POA: Diagnosis not present

## 2022-10-06 DIAGNOSIS — H18413 Arcus senilis, bilateral: Secondary | ICD-10-CM | POA: Diagnosis not present

## 2022-10-06 DIAGNOSIS — H2512 Age-related nuclear cataract, left eye: Secondary | ICD-10-CM | POA: Diagnosis not present

## 2022-10-06 DIAGNOSIS — D3131 Benign neoplasm of right choroid: Secondary | ICD-10-CM | POA: Diagnosis not present

## 2022-10-06 DIAGNOSIS — H25042 Posterior subcapsular polar age-related cataract, left eye: Secondary | ICD-10-CM | POA: Diagnosis not present

## 2022-10-06 DIAGNOSIS — H40013 Open angle with borderline findings, low risk, bilateral: Secondary | ICD-10-CM | POA: Diagnosis not present

## 2022-10-27 DIAGNOSIS — I2510 Atherosclerotic heart disease of native coronary artery without angina pectoris: Secondary | ICD-10-CM | POA: Diagnosis not present

## 2022-10-27 DIAGNOSIS — N183 Chronic kidney disease, stage 3 unspecified: Secondary | ICD-10-CM | POA: Diagnosis not present

## 2022-10-27 DIAGNOSIS — I10 Essential (primary) hypertension: Secondary | ICD-10-CM | POA: Diagnosis not present

## 2022-10-27 DIAGNOSIS — E7800 Pure hypercholesterolemia, unspecified: Secondary | ICD-10-CM | POA: Diagnosis not present

## 2022-10-27 DIAGNOSIS — R7303 Prediabetes: Secondary | ICD-10-CM | POA: Diagnosis not present

## 2022-11-09 DIAGNOSIS — H2512 Age-related nuclear cataract, left eye: Secondary | ICD-10-CM | POA: Diagnosis not present

## 2022-11-09 DIAGNOSIS — H269 Unspecified cataract: Secondary | ICD-10-CM | POA: Diagnosis not present

## 2023-05-18 DIAGNOSIS — Z0000 Encounter for general adult medical examination without abnormal findings: Secondary | ICD-10-CM | POA: Diagnosis not present

## 2023-05-18 DIAGNOSIS — E7800 Pure hypercholesterolemia, unspecified: Secondary | ICD-10-CM | POA: Diagnosis not present

## 2023-05-18 DIAGNOSIS — R7303 Prediabetes: Secondary | ICD-10-CM | POA: Diagnosis not present

## 2023-05-18 DIAGNOSIS — I2510 Atherosclerotic heart disease of native coronary artery without angina pectoris: Secondary | ICD-10-CM | POA: Diagnosis not present

## 2023-05-18 DIAGNOSIS — N183 Chronic kidney disease, stage 3 unspecified: Secondary | ICD-10-CM | POA: Diagnosis not present

## 2023-05-18 DIAGNOSIS — I10 Essential (primary) hypertension: Secondary | ICD-10-CM | POA: Diagnosis not present

## 2023-07-13 ENCOUNTER — Other Ambulatory Visit: Payer: Self-pay | Admitting: Family Medicine

## 2023-07-13 DIAGNOSIS — Z1231 Encounter for screening mammogram for malignant neoplasm of breast: Secondary | ICD-10-CM

## 2023-09-20 DIAGNOSIS — R194 Change in bowel habit: Secondary | ICD-10-CM | POA: Diagnosis not present

## 2023-09-20 DIAGNOSIS — K5900 Constipation, unspecified: Secondary | ICD-10-CM | POA: Diagnosis not present

## 2023-10-06 ENCOUNTER — Ambulatory Visit
Admission: RE | Admit: 2023-10-06 | Discharge: 2023-10-06 | Disposition: A | Discharge status: Home / Self Care | Payer: Medicare Other | Source: Ambulatory Visit | Attending: Family Medicine | Admitting: Family Medicine

## 2023-10-06 DIAGNOSIS — Z1231 Encounter for screening mammogram for malignant neoplasm of breast: Secondary | ICD-10-CM

## 2023-10-25 DIAGNOSIS — K5900 Constipation, unspecified: Secondary | ICD-10-CM | POA: Diagnosis not present

## 2023-11-02 DIAGNOSIS — E119 Type 2 diabetes mellitus without complications: Secondary | ICD-10-CM | POA: Diagnosis not present

## 2023-11-08 DIAGNOSIS — I10 Essential (primary) hypertension: Secondary | ICD-10-CM | POA: Diagnosis not present

## 2023-11-08 DIAGNOSIS — E7800 Pure hypercholesterolemia, unspecified: Secondary | ICD-10-CM | POA: Diagnosis not present

## 2023-11-08 DIAGNOSIS — N183 Chronic kidney disease, stage 3 unspecified: Secondary | ICD-10-CM | POA: Diagnosis not present

## 2023-11-08 DIAGNOSIS — R7303 Prediabetes: Secondary | ICD-10-CM | POA: Diagnosis not present

## 2023-11-08 DIAGNOSIS — I2510 Atherosclerotic heart disease of native coronary artery without angina pectoris: Secondary | ICD-10-CM | POA: Diagnosis not present

## 2023-12-16 DIAGNOSIS — I959 Hypotension, unspecified: Secondary | ICD-10-CM | POA: Diagnosis not present

## 2023-12-16 DIAGNOSIS — I2510 Atherosclerotic heart disease of native coronary artery without angina pectoris: Secondary | ICD-10-CM | POA: Diagnosis not present

## 2023-12-16 DIAGNOSIS — I495 Sick sinus syndrome: Secondary | ICD-10-CM | POA: Diagnosis not present

## 2023-12-16 DIAGNOSIS — R001 Bradycardia, unspecified: Secondary | ICD-10-CM | POA: Diagnosis not present

## 2023-12-17 DIAGNOSIS — R001 Bradycardia, unspecified: Secondary | ICD-10-CM | POA: Diagnosis not present

## 2023-12-17 DIAGNOSIS — N179 Acute kidney failure, unspecified: Secondary | ICD-10-CM | POA: Diagnosis not present

## 2023-12-17 DIAGNOSIS — E785 Hyperlipidemia, unspecified: Secondary | ICD-10-CM | POA: Diagnosis not present

## 2023-12-17 DIAGNOSIS — I10 Essential (primary) hypertension: Secondary | ICD-10-CM | POA: Diagnosis not present

## 2023-12-17 DIAGNOSIS — I2510 Atherosclerotic heart disease of native coronary artery without angina pectoris: Secondary | ICD-10-CM | POA: Diagnosis not present

## 2023-12-17 DIAGNOSIS — Z79899 Other long term (current) drug therapy: Secondary | ICD-10-CM | POA: Diagnosis not present

## 2023-12-17 DIAGNOSIS — R55 Syncope and collapse: Secondary | ICD-10-CM | POA: Diagnosis not present

## 2023-12-17 DIAGNOSIS — E119 Type 2 diabetes mellitus without complications: Secondary | ICD-10-CM | POA: Diagnosis not present

## 2023-12-17 DIAGNOSIS — I495 Sick sinus syndrome: Secondary | ICD-10-CM | POA: Diagnosis not present

## 2023-12-17 DIAGNOSIS — I959 Hypotension, unspecified: Secondary | ICD-10-CM | POA: Diagnosis not present

## 2023-12-17 DIAGNOSIS — Z7982 Long term (current) use of aspirin: Secondary | ICD-10-CM | POA: Diagnosis not present

## 2023-12-17 DIAGNOSIS — I252 Old myocardial infarction: Secondary | ICD-10-CM | POA: Diagnosis not present

## 2023-12-18 DIAGNOSIS — R001 Bradycardia, unspecified: Secondary | ICD-10-CM | POA: Diagnosis not present

## 2023-12-24 DIAGNOSIS — R001 Bradycardia, unspecified: Secondary | ICD-10-CM | POA: Diagnosis not present

## 2023-12-27 DIAGNOSIS — R001 Bradycardia, unspecified: Secondary | ICD-10-CM | POA: Diagnosis not present

## 2023-12-27 DIAGNOSIS — I2510 Atherosclerotic heart disease of native coronary artery without angina pectoris: Secondary | ICD-10-CM | POA: Diagnosis not present

## 2023-12-27 DIAGNOSIS — I10 Essential (primary) hypertension: Secondary | ICD-10-CM | POA: Diagnosis not present

## 2023-12-27 DIAGNOSIS — I495 Sick sinus syndrome: Secondary | ICD-10-CM | POA: Diagnosis not present

## 2024-01-11 DIAGNOSIS — I10 Essential (primary) hypertension: Secondary | ICD-10-CM | POA: Diagnosis not present

## 2024-01-11 DIAGNOSIS — I2510 Atherosclerotic heart disease of native coronary artery without angina pectoris: Secondary | ICD-10-CM | POA: Diagnosis not present

## 2024-01-24 ENCOUNTER — Encounter: Payer: Self-pay | Admitting: Cardiology

## 2024-01-24 ENCOUNTER — Ambulatory Visit: Attending: Cardiology | Admitting: Cardiology

## 2024-01-24 VITALS — BP 114/70 | HR 74 | Ht 63.0 in | Wt 137.8 lb

## 2024-01-24 DIAGNOSIS — I10 Essential (primary) hypertension: Secondary | ICD-10-CM

## 2024-01-24 DIAGNOSIS — R001 Bradycardia, unspecified: Secondary | ICD-10-CM | POA: Insufficient documentation

## 2024-01-24 DIAGNOSIS — I25119 Atherosclerotic heart disease of native coronary artery with unspecified angina pectoris: Secondary | ICD-10-CM | POA: Diagnosis not present

## 2024-01-24 DIAGNOSIS — E782 Mixed hyperlipidemia: Secondary | ICD-10-CM | POA: Diagnosis not present

## 2024-01-24 DIAGNOSIS — N952 Postmenopausal atrophic vaginitis: Secondary | ICD-10-CM | POA: Insufficient documentation

## 2024-01-24 NOTE — Progress Notes (Signed)
 Cardiology Consultation:    Date:  01/24/2024   ID:  Brownie, Gockel [birth year], MRN [id number]  PCP:  Roselind Congo, MD  Cardiologist:  Ralene Burger, MD   Referring MD: Roselind Congo, MD   Chief Complaint  Patient presents with   Loww HR    History of Present Illness:    [name] is a 78 y.o. female who is being seen today for the evaluation of slow heart rate at the request of Roselind Congo, MD. past medical history significant for coronary artery disease in 2007 she suffered from myocardial infarction apparently she got occluded distal portion of left anterior descending artery which left apical hypokinesis.  She also got dyslipidemia and essential hypertension.  Comes today to my office to be established as a patient.  Few weeks ago she was in [place]  started feeling very poorly and went to the emergency room she was found to be in sinus bradycardia rate of 30 with some junctional escape.  Her metoprolol has been withdrawn and she was taking 100 mg metoprolol echocardiogram being done which showed preserved left ventricle ejection fraction without segmental wall motion of her mildly without significant valvular pathology, after that she also were heart rate monitor which show minimum heart rate 55 average heart rate 77.  And she was discharged home in 72 hours for evaluation.  Since the time of hospitalization she is doing fine asymptomatic except she feels her heart speeding up a little bit but that gradually quieting down.  She denies have any chest pain tightness squeezing pressure burning chest.  She is currently 78 within the next few months and she looks terrific.  She does have a treadmill at home with intention to start using it which I recommended.  She does not smoke she is not on any special diet.  Does not have any signs and symptoms of reactivation of coronary disease  Past Medical History:  Diagnosis Date   Chest pain    Coronary artery disease     Headache, acute    Hyperlipidemia    Hypertension    MI, old    APICAL INFERIOR    Past Surgical History:  Procedure Laterality Date   AUGMENTATION MAMMAPLASTY Bilateral    BREAST BIOPSY Right 2017   CARDIAC CATHETERIZATION  08/12/2006   SINGLE VESSEL CAD    Current Medications: Current Meds  Medication Sig   amLODipine  (NORVASC ) 2.5 MG tablet Take 5 mg by mouth daily.   aspirin 81 MG tablet Take 81 mg by mouth daily.   atorvastatin (LIPITOR) 20 MG tablet Take 1 tablet by mouth daily.   citalopram (CELEXA) 20 MG tablet Take 40 mg by mouth daily.   losartan  (COZAAR ) 100 MG tablet Take 1 tablet (100 mg total) by mouth daily. (Patient taking differently: Take 50 mg by mouth daily.)   [DISCONTINUED] ALPRAZolam (XANAX) 0.5 MG tablet Take 0.5 mg by mouth at bedtime as needed for anxiety or sleep.    [DISCONTINUED] cetirizine (ZYRTEC) 10 MG tablet Take 10 mg by mouth daily.   [DISCONTINUED] Cholecalciferol (VITAMIN D) 2000 units CAPS Take 2,000 Units by mouth daily.   [DISCONTINUED] cloNIDine (CATAPRES) 0.1 MG tablet Take 0.1 mg by mouth See admin instructions.  1 tablet Orally Once a day if needed for elevated blood pressure for 100 days   [DISCONTINUED] diclofenac Sodium (VOLTAREN) 1 % GEL Apply 4 g topically 4 (four) times daily.   [DISCONTINUED] ESTRACE VAGINAL 0.1 MG/GM vaginal cream  Place 1 Applicatorful vaginally 3 (three) times a week.   [DISCONTINUED] HYDROcodone -acetaminophen  (NORCO) 5-325 MG tablet Take 1 tablet by mouth every 6 (six) hours as needed for moderate pain.   [DISCONTINUED] Melatonin 10 MG TBDP Take 10 mg by mouth daily.   [DISCONTINUED] metoprolol succinate (TOPROL-XL) 50 MG 24 hr tablet Take 50 mg by mouth daily.    [DISCONTINUED] nitroGLYCERIN  (NITROSTAT ) 0.4 MG SL tablet Place 1 tablet (0.4 mg total) under the tongue every 5 (five) minutes as needed for chest pain (x 3 tabs daily).   [DISCONTINUED] Probiotic Product (PROBIOTIC PO) Take 1 capsule by mouth daily.    [DISCONTINUED] simvastatin (ZOCOR) 20 MG tablet Take 20 mg by mouth daily.    [DISCONTINUED] valACYclovir (VALTREX) 500 MG tablet Take 500 mg by mouth as needed (Out breaks).   [DISCONTINUED] vitamin B-12 (CYANOCOBALAMIN) 1000 MCG tablet Take 1,000 mcg by mouth daily.     Allergies:   Lisinopril and Plavix [clopidogrel bisulfate]   Social History   Socioeconomic History   Marital status: Married    Spouse name: Not on file   Number of children: Not on file   Years of education: Not on file   Highest education level: Not on file  Occupational History   Not on file  Tobacco Use   Smoking status: Never   Smokeless tobacco: Never  Substance and Sexual Activity   Alcohol use: No   Drug use: No   Sexual activity: Not on file  Other Topics Concern   Not on file  Social History Narrative   Not on file   Social Drivers of Health   Financial Resource Strain: Not on file  Food Insecurity: Not on file  Transportation Needs: Not on file  Physical Activity: Not on file  Stress: Not on file  Social Connections: Not on file     Family History: The patient's family history includes Alzheimer's disease in her mother; Cancer - Lung in an other family member; Healthy in her sister; Heart murmur in her sister. There is no history of Breast cancer. ROS:   Please see the history of present illness.    All 14 point review of systems negative except as described per history of present illness.  EKGs/Labs/Other Studies Reviewed:    The following studies were reviewed today:   EKG:  EKG Interpretation Date/Time:  Tuesday Jan 24 2024 15:38:03 EDT Ventricular Rate:  74 PR Interval:  186 QRS Duration:  78 QT Interval:  392 QTC Calculation: 435 R Axis:   -28  Text Interpretation: Sinus rhythm with Premature atrial complexes Nonspecific T wave abnormality Abnormal ECG When compared with ECG of 19-Dec-2007 19:15, Premature atrial complexes are now Present Confirmed by Ralene Burger  [phone]) on 01/24/2024 3:41:22 PM    Recent Labs: No results found for requested labs within last 365 days.  Recent Lipid Panel    Component Value Date/Time   CHOL 150 07/09/2016 1506   TRIG 104 07/09/2016 1506   HDL 51 07/09/2016 1506   CHOLHDL 2.9 07/09/2016 1506   VLDL 21 07/09/2016 1506   LDLCALC 78 07/09/2016 1506    Physical Exam:    VS:  BP 114/70 (BP Location: Right Arm, Patient Position: Sitting)   Pulse 74   Ht 5\' 3"  (1.6 m)   Wt 137 lb 12.8 oz (62.5 kg)   SpO2 99%   BMI 24.41 kg/m     Wt Readings from Last 3 Encounters:  01/24/24 137 lb 12.8 oz (62.5 kg)  07/09/16 137 lb (62.1 kg)  07/09/15 136 lb (61.7 kg)     GEN:  Well nourished, well developed in no acute distress HEENT: Normal NECK: No JVD; No carotid bruits LYMPHATICS: No lymphadenopathy CARDIAC: RRR, no murmurs, no rubs, no gallops RESPIRATORY:  Clear to auscultation without rales, wheezing or rhonchi  ABDOMEN: Soft, non-tender, non-distended MUSCULOSKELETAL:  No edema; No deformity  SKIN: Warm and dry NEUROLOGIC:  Alert and oriented x 3 PSYCHIATRIC:  Normal affect   ASSESSMENT:    1. Hypertension, unspecified type   2. Coronary artery disease involving native heart with angina pectoris, unspecified vessel or lesion type (HCC)   3. Sinus bradycardia   4. Mixed hyperlipidemia    PLAN:    In order of problems listed above:  Sinus bradycardia probably caused by beta-blocker.  She has been taking metoprolol 100 mg for many years.  I suspect she does have some sinus node dysfunction but now without beta-blocker she seems to be doing well reviewing her monitor her maximal heart rate was 140 so I do not think we get significant sinus node dysfunction now without beta-blocker.  My initial intention was even tried to put her on small dose of beta-blocker however report of bodies were show episodes of heart block interesting with ventricular rate of 86 only lasting for split-second I do not see a recording  of this however this is something we can keep in mind and keep monitoring.  I told her if she became dizzy passing out she needs to let me know. Coronary disease stable from that point review no signs or symptoms of reactivation of the problem appropriate guideline directed medical therapy. Mixed dyslipidemia her cholesterol is well-controlled continue present management   Medication Adjustments/Labs and Tests Ordered: Current medicines are reviewed at length with the patient today.  Concerns regarding medicines are outlined above.  Orders Placed This Encounter  Procedures   EKG 12-Lead   No orders of the defined types were placed in this encounter.   Signed, Manfred Seed, MD, [facility]. 01/24/2024 4:58 PM    [facility]

## 2024-01-24 NOTE — Patient Instructions (Signed)

## 2024-01-31 ENCOUNTER — Ambulatory Visit: Admitting: Cardiology

## 2024-03-29 DIAGNOSIS — R001 Bradycardia, unspecified: Secondary | ICD-10-CM | POA: Diagnosis not present

## 2024-03-29 DIAGNOSIS — I10 Essential (primary) hypertension: Secondary | ICD-10-CM | POA: Diagnosis not present

## 2024-03-29 DIAGNOSIS — I495 Sick sinus syndrome: Secondary | ICD-10-CM | POA: Diagnosis not present

## 2024-03-29 DIAGNOSIS — I2510 Atherosclerotic heart disease of native coronary artery without angina pectoris: Secondary | ICD-10-CM | POA: Diagnosis not present

## 2024-04-05 DIAGNOSIS — I2510 Atherosclerotic heart disease of native coronary artery without angina pectoris: Secondary | ICD-10-CM | POA: Diagnosis not present

## 2024-04-05 DIAGNOSIS — E7800 Pure hypercholesterolemia, unspecified: Secondary | ICD-10-CM | POA: Diagnosis not present

## 2024-04-05 DIAGNOSIS — N183 Chronic kidney disease, stage 3 unspecified: Secondary | ICD-10-CM | POA: Diagnosis not present

## 2024-04-05 DIAGNOSIS — I10 Essential (primary) hypertension: Secondary | ICD-10-CM | POA: Diagnosis not present

## 2024-04-26 ENCOUNTER — Encounter: Payer: Self-pay | Admitting: Cardiology

## 2024-04-26 ENCOUNTER — Ambulatory Visit: Attending: Cardiology | Admitting: Cardiology

## 2024-04-26 VITALS — BP 122/76 | HR 76 | Ht 63.0 in | Wt 137.0 lb

## 2024-04-26 DIAGNOSIS — E782 Mixed hyperlipidemia: Secondary | ICD-10-CM

## 2024-04-26 DIAGNOSIS — K863 Pseudocyst of pancreas: Secondary | ICD-10-CM | POA: Insufficient documentation

## 2024-04-26 DIAGNOSIS — N183 Chronic kidney disease, stage 3 unspecified: Secondary | ICD-10-CM | POA: Insufficient documentation

## 2024-04-26 DIAGNOSIS — H9193 Unspecified hearing loss, bilateral: Secondary | ICD-10-CM | POA: Insufficient documentation

## 2024-04-26 DIAGNOSIS — R001 Bradycardia, unspecified: Secondary | ICD-10-CM | POA: Diagnosis not present

## 2024-04-26 DIAGNOSIS — I25119 Atherosclerotic heart disease of native coronary artery with unspecified angina pectoris: Secondary | ICD-10-CM

## 2024-04-26 DIAGNOSIS — K5730 Diverticulosis of large intestine without perforation or abscess without bleeding: Secondary | ICD-10-CM | POA: Insufficient documentation

## 2024-04-26 DIAGNOSIS — I10 Essential (primary) hypertension: Secondary | ICD-10-CM | POA: Diagnosis not present

## 2024-04-26 DIAGNOSIS — F419 Anxiety disorder, unspecified: Secondary | ICD-10-CM | POA: Insufficient documentation

## 2024-04-26 NOTE — Patient Instructions (Signed)

## 2024-04-26 NOTE — Progress Notes (Signed)
 Cardiology Office Note:    Date:  04/26/2024   ID:  [name], [name] 1945/11/03, MRN [id number]  PCP:  Arloa Elsie SAUNDERS, MD  Cardiologist:  Lamar Fitch, MD    Referring MD: Arloa Elsie SAUNDERS, MD   Chief Complaint  Patient presents with   Follow-up    History of Present Illness:    [name] is a 78 y.o. female past medical history significant for myocardial infarction apparently in 2000 send that she suffered from myocardial infarction she was found to have occluded distal portion of the left anterior descending artery no intervention has been done apical wall hypokinesis additional problem clued dyslipidemia, essential hypertension.  Few weeks ago she was in [place]  where she was there she started feeling poorly she went to the emergency room she was found to have a heart rate of 30 with junctional escape, her metoprolol 100 mg has been withdrawn, echocardiogram done at that time showed normal left ventricular ejection fraction without significant wall motion abnormality after that she has been doing quite well.  Denies have any chest pain tightness squeezing pressure burning chest.  She is always very active she always walk on the regular basis and she has no difficulty doing it.  No chest pain tightness squeezing pressure burning chest no dizziness no passing out  Past Medical History:  Diagnosis Date   Chest pain    Coronary artery disease    Headache, acute    Hyperlipidemia    Hypertension    MI, old    APICAL INFERIOR    Past Surgical History:  Procedure Laterality Date   AUGMENTATION MAMMAPLASTY Bilateral    BREAST BIOPSY Right 2017   CARDIAC CATHETERIZATION  08/12/2006   SINGLE VESSEL CAD    Current Medications: Current Meds  Medication Sig   amLODipine  (NORVASC ) 2.5 MG tablet Take 2.5 mg by mouth daily.   aspirin 81 MG tablet Take 81 mg by mouth daily.   atorvastatin (LIPITOR) 20 MG tablet Take 1 tablet by mouth daily.   citalopram (CELEXA) 20 MG  tablet Take 20 mg by mouth daily.   losartan  (COZAAR ) 50 MG tablet Take 50 mg by mouth daily.   valACYclovir (VALTREX) 500 MG tablet Take 500 mg by mouth daily as needed.     Allergies:   Lisinopril and Plavix [clopidogrel bisulfate]   Social History   Socioeconomic History   Marital status: Married    Spouse name: Not on file   Number of children: Not on file   Years of education: Not on file   Highest education level: Not on file  Occupational History   Not on file  Tobacco Use   Smoking status: Never   Smokeless tobacco: Never  Substance and Sexual Activity   Alcohol use: No   Drug use: No   Sexual activity: Not on file  Other Topics Concern   Not on file  Social History Narrative   Not on file   Social Drivers of Health   Financial Resource Strain: Not on file  Food Insecurity: Not on file  Transportation Needs: Not on file  Physical Activity: Not on file  Stress: Not on file  Social Connections: Not on file     Family History: The patient's family history includes Alzheimer's disease in her mother; Cancer - Lung in an other family member; Healthy in her sister; Heart murmur in her sister. There is no history of Breast cancer. ROS:   Please see the history of present  illness.    All 14 point review of systems negative except as described per history of present illness  EKGs/Labs/Other Studies Reviewed:         Recent Labs: No results found for requested labs within last 365 days.  Recent Lipid Panel    Component Value Date/Time   CHOL 150 07/09/2016 1506   TRIG 104 07/09/2016 1506   HDL 51 07/09/2016 1506   CHOLHDL 2.9 07/09/2016 1506   VLDL 21 07/09/2016 1506   LDLCALC 78 07/09/2016 1506    Physical Exam:    VS:  BP 122/76   Pulse 76   Ht 5' 3 (1.6 m)   Wt 137 lb (62.1 kg)   SpO2 97%   BMI 24.27 kg/m     Wt Readings from Last 3 Encounters:  04/26/24 137 lb (62.1 kg)  01/24/24 137 lb 12.8 oz (62.5 kg)  07/09/16 137 lb (62.1 kg)      GEN:  Well nourished, well developed in no acute distress HEENT: Normal NECK: No JVD; No carotid bruits LYMPHATICS: No lymphadenopathy CARDIAC: RRR, no murmurs, no rubs, no gallops RESPIRATORY:  Clear to auscultation without rales, wheezing or rhonchi  ABDOMEN: Soft, non-tender, non-distended MUSCULOSKELETAL:  No edema; No deformity  SKIN: Warm and dry LOWER EXTREMITIES: no swelling NEUROLOGIC:  Alert and oriented x 3 PSYCHIATRIC:  Normal affect   ASSESSMENT:    1. Coronary artery disease involving native heart with angina pectoris, unspecified vessel or lesion type (HCC)   2. Primary hypertension   3. Sinus bradycardia   4. Mixed hyperlipidemia    PLAN:    In order of problems listed above:  History of coronary artery disease stable on guideline directed medical therapy which I continue. Essential hypertension blood pressure well-controlled continue present management. Dyslipidemia she is taking Lipitor 20 which is moderate intensity statin, cholesterol from March of this year show LDL 60 HDL 57 we will continue present management. She is exercising on a regular basis which I strongly recommended to do. Sinus bradycardia denies have any symptoms ask him to let me know if she became dizzy or pass out or decreased ability to exercise   Medication Adjustments/Labs and Tests Ordered: Current medicines are reviewed at length with the patient today.  Concerns regarding medicines are outlined above.  No orders of the defined types were placed in this encounter.  Medication changes: No orders of the defined types were placed in this encounter.   Signed, Lamar DOROTHA Fitch, MD, [facility] 04/26/2024 1:55 PM    [facility]

## 2024-05-02 DIAGNOSIS — N183 Chronic kidney disease, stage 3 unspecified: Secondary | ICD-10-CM | POA: Diagnosis not present

## 2024-05-02 DIAGNOSIS — N390 Urinary tract infection, site not specified: Secondary | ICD-10-CM | POA: Diagnosis not present

## 2024-05-05 ENCOUNTER — Emergency Department (HOSPITAL_BASED_OUTPATIENT_CLINIC_OR_DEPARTMENT_OTHER)
Admission: EM | Admit: 2024-05-05 | Discharge: 2024-05-05 | Disposition: A | Discharge status: Home / Self Care | Attending: Emergency Medicine | Admitting: Emergency Medicine

## 2024-05-05 ENCOUNTER — Emergency Department (HOSPITAL_BASED_OUTPATIENT_CLINIC_OR_DEPARTMENT_OTHER)

## 2024-05-05 ENCOUNTER — Encounter (HOSPITAL_BASED_OUTPATIENT_CLINIC_OR_DEPARTMENT_OTHER): Payer: Self-pay | Admitting: Emergency Medicine

## 2024-05-05 ENCOUNTER — Other Ambulatory Visit: Payer: Self-pay

## 2024-05-05 DIAGNOSIS — N368 Other specified disorders of urethra: Secondary | ICD-10-CM | POA: Insufficient documentation

## 2024-05-05 DIAGNOSIS — Z7982 Long term (current) use of aspirin: Secondary | ICD-10-CM | POA: Insufficient documentation

## 2024-05-05 DIAGNOSIS — N938 Other specified abnormal uterine and vaginal bleeding: Secondary | ICD-10-CM | POA: Diagnosis present

## 2024-05-05 LAB — CBC
HCT: 37.7 % (ref 36.0–46.0)
Hemoglobin: 12.8 g/dL (ref 12.0–15.0)
MCH: 31.8 pg (ref 26.0–34.0)
MCHC: 34 g/dL (ref 30.0–36.0)
MCV: 93.8 fL (ref 80.0–100.0)
Platelets: 256 K/uL (ref 150–400)
RBC: 4.02 MIL/uL (ref 3.87–5.11)
RDW: 11.9 % (ref 11.5–15.5)
WBC: 8.3 K/uL (ref 4.0–10.5)
nRBC: 0 % (ref 0.0–0.2)

## 2024-05-05 NOTE — Discharge Instructions (Addendum)
 Your ultrasound was reassuring.  No concerning mass.  Your blood work was normal.  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  There is nothing specific to do for this now besides limiting trauma to the area especially when wiping.  Recommend gently wiping with moist wipe intake.  It is recommended that if you have difficulty urinating, a Foley catheter to be inserted.  If you develop worsening bleeding, worsening pain, fever, pus drainage, please return to the emergency department.  I will refer you into the urogynecologist to be reevaluated regarding treatment options going forward.

## 2024-05-05 NOTE — ED Triage Notes (Signed)
 Pt reports she was treated for UTI starting 2 days ago; initially on Bactrim then changed to Augmentin; sxs have improved; reports vaginal bleeding began around 12pm today, denies pain; reports amt is small

## 2024-05-05 NOTE — ED Provider Notes (Signed)
 [place] [facility] Provider Note   CSN: [id number] Arrival date & time: 05/05/24  1332     Patient presents with: Vaginal Bleeding   [name] is a 78 y.o. female.    Vaginal Bleeding   78 year old female presents emergency department with complaints of vaginal spotting.  States that she recently was placed on antibiotics for a UTI due to malodorous urine.  This was initially Bactrim and switched to Augmentin.  States around noon today, noticed scant amount of vaginal spotting.  States she has a history of hysterectomy with bilateral salpingo oophorectomy in the 87s.  Has not had vaginal bleeding since then.  States that her malodorous urine has since improved.  Denies any fevers, chills, abdominal pain, vaginal pain.  Denies blood thinner use.  Denies any known trauma/injury.  States that she is not currently sexually active.  Does have history of atrophic vaginitis intermittently uses topical estrogen but does not feel like that is what is causing this.  Presents emergency department for further assessment.  Past medical history significant for CAD, MI, hyperlipidemia, hypertension  Prior to Admission medications   Medication Sig Start Date End Date Taking? Authorizing Provider  amLODipine  (NORVASC ) 2.5 MG tablet Take 2.5 mg by mouth daily. 01/11/24   [provider]  aspirin 81 MG tablet Take 81 mg by mouth daily.    [provider]  atorvastatin (LIPITOR) 20 MG tablet Take 1 tablet by mouth daily.    [provider]  citalopram (CELEXA) 20 MG tablet Take 20 mg by mouth daily.    [provider]  losartan  (COZAAR ) 50 MG tablet Take 50 mg by mouth daily.    [provider]  valACYclovir (VALTREX) 500 MG tablet Take 500 mg by mouth daily as needed.    [provider]    Allergies: Lisinopril and Plavix [clopidogrel bisulfate]    Review of Systems  Genitourinary:  Positive for vaginal  bleeding.  All other systems reviewed and are negative.   Updated Vital Signs BP 138/86 (BP Location: Right Arm)   Pulse 100   Temp 98.2 F (36.8 C) (Oral)   Resp 16   Ht 5' 3 (1.6 m)   Wt 62 kg   SpO2 95%   BMI 24.21 kg/m   Physical Exam Vitals and nursing note reviewed. Exam conducted with a chaperone present.  Constitutional:      General: She is not in acute distress.    Appearance: She is well-developed.  HENT:     Head: Normocephalic and atraumatic.  Eyes:     Conjunctiva/sclera: Conjunctivae normal.  Cardiovascular:     Rate and Rhythm: Normal rate and regular rhythm.     Heart sounds: Murmur heard.  Pulmonary:     Effort: Pulmonary effort is normal. No respiratory distress.     Breath sounds: Normal breath sounds.  Abdominal:     Palpations: Abdomen is soft.     Tenderness: There is no abdominal tenderness. There is no guarding.  Genitourinary:    Comments: Scant amount of rust colored blood in vaginal vault.  Vaginal cuff in place.  No obvious lesion as source of bleeding within the vagina itself.  Patient with morbid like mass appreciated circumferentially around urethra.  Area not reproducible on exam.  Area appears highly vascular, red in appearance.  Patient did elicit pain in the area of that urethral mass during the pelvic exam although not having pain before then. Musculoskeletal:  General: No swelling.     Cervical back: Neck supple.  Skin:    General: Skin is warm and dry.     Capillary Refill: Capillary refill takes less than 2 seconds.  Neurological:     Mental Status: She is alert.  Psychiatric:        Mood and Affect: Mood normal.     (all labs ordered are listed, but only abnormal results are displayed) Labs Reviewed  CBC    EKG: None  Radiology: No results found.   Procedures   Medications Ordered in the ED - No data to display  Clinical Course as of 05/05/24 1427  Sat May 05, 2024  1412  No uterus 1982 with  tubes/ovaries 1984 [CR]    Clinical Course User Index [CR] Silver Wonda LABOR, [place]                                 Medical Decision Making Amount and/or Complexity of Data Reviewed Labs: ordered.   This patient presents to the ED for concern of vaginal bleeding, this involves an extensive number of treatment options, and is a complaint that carries with it a high risk of complications and morbidity.  The differential diagnosis includes vaginal atrophy, malignancy, coagulopathy, pelvic organ prolapse, other   Co morbidities that complicate the patient evaluation  See HPI   Additional history obtained:  Additional history obtained from EMR External records from outside source obtained and reviewed including hospital records   Lab Tests:  I Ordered, and personally interpreted labs.  The pertinent results include: No leukocytosis.  No evidence of anemia.  Placed within range.   Imaging Studies ordered:  I ordered imaging studies including pelvic ultrasound I independently visualized and interpreted imaging which showed uterus, fallopian tubes, ovaries not present.  No obvious abnormality. I agree with the radiologist interpretation   Cardiac Monitoring: / EKG:  N/a  Consultations Obtained:  I requested consultation with attending Dr. Darra who is in agreement treatment plan going forward   Problem List / ED Course / Critical interventions / Medication management  Urethral prolapse, bleeding Reevaluation of the patient showed that the patient stayed the same I have reviewed the patients home medicines and have made adjustments as needed   Social Determinants of Health:  Denies tobacco, illicit drug use.   Test / Admission - Considered:  Urethral prolapse, bleeding Vitals signs within normal range and stable throughout visit. Laboratory/imaging studies significant for: See above 78 year old female presents emergency department with complaints of vaginal  spotting.  States that she recently was placed on antibiotics for a UTI due to malodorous urine.  This was initially Bactrim and switched to Augmentin.  States around noon today, noticed scant amount of vaginal spotting.  States she has a history of hysterectomy with bilateral salpingo oophorectomy in the 38s.  Has not had vaginal bleeding since then.  States that her malodorous urine has since improved.  Denies any fevers, chills, abdominal pain, vaginal pain.  Denies blood thinner use.  Denies any known trauma/injury.  States that she is not currently sexually active.  Does have history of atrophic vaginitis intermittently uses topical estrogen but does not feel like that is what is causing this.  Presents emergency department for further assessment. On exam, mobile like mass appreciated superior aspect of urethra.  Vaginal exam showed faint amount of pus, blood in the vaginal vault without appreciable lesion.  No cervix  present.  Vaginal cuff unremarkable.  Abdomen nontender.  CBC showed normal hemoglobin.  Pelvic ultrasound unremarkable.  Bleeding is likely from urethral prolapse.  Patient is able to urinate by herself without any difficulty at this time.  Will refer patient to urogynecology for reevaluation.  Treatment plan discussed with patient she is understanding was agreeable.  Patient well-appearing, afebrile in no acute distress upon discharge. Worrisome signs and symptoms were discussed with the patient, and the patient acknowledged understanding to return to the ED if noticed. Patient was stable upon discharge.       Final diagnoses:  None    ED Discharge Orders     None          Silver Wonda LABOR, [place] 05/05/24 1700    Darra Fonda MATSU, MD 05/06/24 [phone]

## 2024-05-14 ENCOUNTER — Encounter: Payer: Self-pay | Admitting: Obstetrics and Gynecology

## 2024-05-14 ENCOUNTER — Encounter: Payer: Self-pay | Admitting: Obstetrics

## 2024-05-14 ENCOUNTER — Other Ambulatory Visit: Payer: Self-pay | Admitting: Obstetrics and Gynecology

## 2024-05-14 ENCOUNTER — Ambulatory Visit: Admitting: Obstetrics

## 2024-05-14 VITALS — BP 157/92 | HR 85

## 2024-05-14 DIAGNOSIS — N952 Postmenopausal atrophic vaginitis: Secondary | ICD-10-CM

## 2024-05-14 DIAGNOSIS — N8110 Cystocele, unspecified: Secondary | ICD-10-CM | POA: Diagnosis not present

## 2024-05-14 DIAGNOSIS — N368 Other specified disorders of urethra: Secondary | ICD-10-CM

## 2024-05-14 DIAGNOSIS — R351 Nocturia: Secondary | ICD-10-CM | POA: Insufficient documentation

## 2024-05-14 DIAGNOSIS — K5900 Constipation, unspecified: Secondary | ICD-10-CM | POA: Diagnosis not present

## 2024-05-14 LAB — POCT URINALYSIS DIP (CLINITEK)
Bilirubin, UA: NEGATIVE
Bilirubin, UA: NEGATIVE
Blood, UA: NEGATIVE
Glucose, UA: NEGATIVE mg/dL
Glucose, UA: NEGATIVE mg/dL
Ketones, POC UA: NEGATIVE mg/dL
Ketones, POC UA: NEGATIVE mg/dL
Leukocytes, UA: NEGATIVE
Nitrite, UA: NEGATIVE
Nitrite, UA: NEGATIVE
POC PROTEIN,UA: NEGATIVE
POC PROTEIN,UA: NEGATIVE
Spec Grav, UA: 1.02 (ref 1.010–1.025)
Spec Grav, UA: 1.01 (ref 1.010–1.025)
Urobilinogen, UA: 0.2 U/dL
Urobilinogen, UA: 0.2 U/dL
pH, UA: 5.5 (ref 5.0–8.0)
pH, UA: 6 (ref 5.0–8.0)

## 2024-05-14 MED ORDER — ESTROGENS CONJUGATED 0.625 MG/GM VA CREA: TOPICAL_CREAM | VAGINAL | Status: DC

## 2024-05-14 MED ORDER — ESTRADIOL 0.1 MG/GM VA CREA: 0.5000 g | TOPICAL_CREAM | VAGINAL | 11 refills | Status: AC

## 2024-05-14 NOTE — Assessment & Plan Note (Addendum)
-   asympatomatic - For treatment of pelvic organ prolapse, we discussed options for management including expectant management, conservative management, and surgical management, such as Kegels, a pessary, pelvic floor physical therapy, and specific surgical procedures. - We discussed two options for prolapse repair:  - aginal repair without mesh - Pros - safer, no mesh complications - Cons - not as strong as mesh repair, higher risk of recurrence

## 2024-05-14 NOTE — Assessment & Plan Note (Addendum)
-   started miralax 2 capful and fiber supplementation daily - For constipation, we reviewed the importance of a better bowel regimen.  We also discussed the importance of avoiding chronic straining, as it can exacerbate her pelvic floor symptoms; we discussed treating constipation and straining prior to surgery, as postoperative straining can lead to damage to the repair and recurrence of symptoms. We discussed initiating therapy with increasing fluid intake, fiber supplementation, stool softeners, and laxatives such as miralax.  - discussed association with pelvic floor disorders - encouraged squatting position for defecation and avoid straining

## 2024-05-14 NOTE — Patient Instructions (Addendum)
 For vaginal atrophy (thinning of the vaginal tissue that can cause dryness and burning) and UTI prevention we discussed estrogen replacement in the form of vaginal cream.   Start vaginal estrogen therapy nightly for two weeks then 2 times weekly at night. This can be placed with your finger or an applicator inside the vagina and around the urethra.  Please let us  know if the prescription is too expensive and we can look for alternative options.   Is vaginal estrogen therapy safe for me? Vaginal estrogen preparations act on the vaginal skin, and only a very tiny amount is absorbed into the bloodstream (0.01%).  They work in a similar way to hand or face cream.  There is minimal absorption and they are therefore perfectly safe. If you have had breast cancer and have persistent troublesome symptoms which aren't settling with vaginal moisturisers and lubricants, local estrogen treatment may be a possibility, but consultation with your oncologist should take place first.   - discussed proper vulvar care, warm compression, avoid pad use, cotton only underwear and barrier ointment if needed  - encouraged Vit E suppository, moisturizer with Replens/Revaree  For night time frequency: - avoid fluid intake 3 hours before bedtime - due to snoring, consider sleep study to rule out sleep apnea  Constipation: Our goal is to achieve formed bowel movements daily or every-other-day.  You may need to try different combinations of the following options to find what works best for you - everybody's body works differently so feel free to adjust the dosages as needed.  Some options to help maintain bowel health include:  Dietary changes (more leafy greens, vegetables and fruits; less processed foods) Fiber supplementation (Benefiber, FiberCon, Metamucil or Psyllium). Start slow and increase gradually to full dose. Over-the-counter agents such as: stool softeners (Docusate or Colace) and/or laxatives (Miralax, milk of  magnesia)  Power Pudding is a natural mixture that may help your constipation.  To make blend 1 cup applesauce, 1 cup wheat bran, and 3/4 cup prune juice, refrigerate and then take 1 tablespoon daily with a large glass of water as needed.   Women should try to eat at least 21 to 25 grams of fiber a day, while men should aim for 30 to 38 grams a day. You can add fiber to your diet with food or a fiber supplement such as psyllium (metamucil), benefiber, or fibercon.   Here's a look at how much dietary fiber is found in some common foods. When buying packaged foods, check the Nutrition Facts label for fiber content. It can vary among brands.  Fruits Serving size Total fiber (grams)*  Raspberries 1 cup 8.0  Pear 1 medium 5.5  Apple, with skin 1 medium 4.5  Banana 1 medium 3.0  Orange 1 medium 3.0  Strawberries 1 cup 3.0   Vegetables Serving size Total fiber (grams)*  Green peas, boiled 1 cup 9.0  Broccoli, boiled 1 cup chopped 5.0  Turnip greens, boiled 1 cup 5.0  Brussels sprouts, boiled 1 cup 4.0  Potato, with skin, baked 1 medium 4.0  Sweet corn, boiled 1 cup 3.5  Cauliflower, raw 1 cup chopped 2.0  Carrot, raw 1 medium 1.5   Grains Serving size Total fiber (grams)*  Spaghetti, whole-wheat, cooked 1 cup 6.0  Barley, pearled, cooked 1 cup 6.0  Bran flakes 3/4 cup 5.5  Quinoa, cooked 1 cup 5.0  Oat bran muffin 1 medium 5.0  Oatmeal, instant, cooked 1 cup 5.0  Popcorn, air-popped 3 cups 3.5  [employer]  rice, cooked 1 cup 3.5  Bread, whole-wheat 1 slice 2.0  Bread, rye 1 slice 2.0   Legumes, nuts and seeds Serving size Total fiber (grams)*  Split peas, boiled 1 cup 16.0  Lentils, boiled 1 cup 15.5  Black beans, boiled 1 cup 15.0  Baked beans, canned 1 cup 10.0  Chia seeds 1 ounce 10.0  Almonds 1 ounce (23 nuts) 3.5  Pistachios 1 ounce (49 nuts) 3.0  Sunflower kernels 1 ounce 3.0  *Rounded to nearest 0.5 gram. Source: [employer] for [employer],  [employer] have a stage 2 (out of 4) prolapse.  We discussed the fact that it is not life threatening but there are several treatment options. For treatment of pelvic organ prolapse, we discussed options for management including expectant management, conservative management, and surgical management, such as Kegels, a pessary, pelvic floor physical therapy, and specific surgical procedures.

## 2024-05-14 NOTE — Progress Notes (Signed)
 New Patient Evaluation and Consultation  Referring Provider: Silver Wonda LABOR, PA PCP: Arloa Elsie SAUNDERS, MD Date of Service: 05/14/2024  SUBJECTIVE Chief Complaint: New Patient (Initial Visit) ([name] is a 78 y.o. female here today for vaginal bleeding and female organ prolapse.)  History of Present Illness: [name] is a 78 y.o. White or Caucasian female seen in consultation at the request of PA Silver for evaluation of possible urethral prolapse with bleeding.    Symptoms started 9 days ago, felt a gush of fluid and noted blood on underwear. Reports cherry tomato size vaginal bulge, denies change in size.  Denies urinary leakage.  Unable to sit due to protrusion from urethra due to discomfort. Denies pain.  Changes pad 2x/day with thin stripe of blood. Denies blood clots.  Denies fever, chill, dysuria, or sensation of incomplete emptying.  Has Rx for vaginal estrogen in the past for vaginal dryness, stopped when she stopped being sexually active around 5 years ago. Intermittent use without consistency.  Using estrogen cream 1g nightly since ED evaluation on 05/05/24 Uses BP medication based on BP reading due to prior sinus bradycardia HRT until she moved here 25 years ago  Review of records significant for: History of pancreatitis    CLINICAL DATA:  Vaginal bleeding.  History of complete hysterectomy.   EXAM: TRANSABDOMINAL ULTRASOUND OF PELVIS   TECHNIQUE: Transabdominal ultrasound examination of the pelvis was performed including evaluation of the uterus, ovaries, adnexal regions, and pelvic cul-de-sac. Patient declined transvaginal exam.   COMPARISON:  07/05/2017.   FINDINGS: Uterus   Surgically absent.   Right ovary   Surgically absent.   Left ovary   Surgically absent.   Other findings:  No mass or free fluid is seen.   IMPRESSION: Uterus and ovaries are surgically absent by history. No mass or free fluid is seen.     Electronically  Signed   By: Leita Birmingham M.D.   On: 05/05/2024 16:17    Urinary Symptoms: Does not leak urine.   Day time voids 4-5.  Nocturia: 2 times per night to void. Stops fluid intake around 8pm at night, sleeps around 11-11:30pm Reports snoring, denies sleep apnea Denies leg swelling Voiding dysfunction:  does not empty bladder well.  Patient does not use a catheter to empty bladder.  When urinating, patient feels she has no difficulties Drinks: 40oz water per day, 10-12oz hot tea, 8-10oz diet sprite  UTIs: 1 UTI's in the last year.   Denies history of kidney or bladder stones, pyelonephritis, bladder cancer, and kidney cancer No results found for the last 90 days.   Pelvic Organ Prolapse Symptoms:                  Patient Admits to a feeling of a bulge the vaginal area. It has been present for 1 weeks.  Patient Admits to seeing a bulge.  This bulge is bothersome.  Bowel Symptom: Bowel movements: 3-4 time(s) per week for 4 months on miralax 1 capful 2x/day Stool consistency: hard with Type I stool, more III-IV stool since daily miralax Straining: yes, occasionally Splinting: yes. Perineum for 2 years  Incomplete evacuation: no.  Patient Denies accidental bowel leakage / fecal incontinence Bowel regimen: 2 gummy fiber and miralax 1 capful 2x/day Last colonoscopy: Results 2 tubular adenomas, diverticulosis and internal hemorrhoids. Repeat was recommended in 5 years.  HM Colonoscopy          Completed or No Longer Recommended  Colonoscopy  Discontinued      Frequency changed to Never automatically (Topic No Longer Applies)   09/29/2021  Outside Claim: PR COLSC FLX W/RMVL OF TUMOR POLYP LESION SNARE TQ   Only the first 1 history entries have been loaded, but more history exists.                Sexual Function Sexually active: no.  Sexual orientation: Straight Pain with sex: No  Pelvic Pain Denies pelvic pain   Past Medical History:  Past Medical History:   Diagnosis Date   Chest pain    Coronary artery disease    Headache, acute    Hyperlipidemia    Hypertension    MI, old    APICAL INFERIOR     Past Surgical History:   Past Surgical History:  Procedure Laterality Date   ABDOMINAL HYSTERECTOMY  1981   AUB with anemia   AUGMENTATION MAMMAPLASTY Bilateral    BILATERAL SALPINGOOPHORECTOMY  1985   ovarian cysts   BREAST BIOPSY Right 2017   CARDIAC CATHETERIZATION  08/12/2006   SINGLE VESSEL CAD     Past OB/GYN History: OB History  Gravida Para Term Preterm AB Living  3 2 2  1 2   SAB IAB Ectopic Multiple Live Births  1    2    # Outcome Date GA Lbr Len/2nd Weight Sex Type Anes PTL Lv  3 Term     F Vag-Spont   LIV  2 SAB           1 Term     F Vag-Spont   LIV    Vaginal deliveries: largest infant 6lb13oz, episiotomy repaired. Forceps/ Vacuum deliveries: 0, Cesarean section: 0 D&C for postpartum hemorrhage with 1st delivery Menopausal: Yes, at age 56s with surgical menopause, Denies vaginal bleeding since menopause Contraception: s/p hysterectomy 1985. Last pap smear was unclear.  Any history of abnormal pap smears: no. No results found for: DIAGPAP, HPVHIGH, ADEQPAP  Medications: Patient has a current medication list which includes the following prescription(s): amlodipine , aspirin, atorvastatin, citalopram, conjugated estrogens , losartan , polyethylene glycol powder, and valacyclovir.   Allergies: Patient is allergic to lisinopril and plavix [clopidogrel bisulfate].   Social History:  Social History   Tobacco Use   Smoking status: Never   Smokeless tobacco: Never  Vaping Use   Vaping status: Never Used  Substance Use Topics   Alcohol use: No   Drug use: No    Relationship status: married Patient lives with with her spouse of 38yrs.   Patient is not employed. Regular exercise: No History of abuse: No  Family History:   Family History  Problem Relation Age of Onset   Alzheimer's disease Mother     Heart murmur Sister    Healthy Sister    Cancer - Lung Other    Breast cancer Neg Hx    Bladder Cancer Neg Hx    Renal cancer Neg Hx    Uterine cancer Neg Hx      Review of Systems: Review of Systems  Constitutional:  Positive for malaise/fatigue. Negative for fever and weight loss.  Respiratory:  Negative for cough, shortness of breath and wheezing.   Cardiovascular:  Negative for chest pain, palpitations and leg swelling.  Gastrointestinal:  Positive for constipation. Negative for abdominal pain and blood in stool.  Genitourinary:  Negative for dysuria, frequency, hematuria and urgency.       Vaginal discharge  Skin:  Negative for rash.  Neurological:  Negative for dizziness, weakness  and headaches.  Endo/Heme/Allergies:  Does not bruise/bleed easily.       Hot flashes  Psychiatric/Behavioral:  Negative for depression. The patient is not nervous/anxious.      OBJECTIVE Physical Exam: Vitals:   05/14/24 1011 05/14/24 1119  BP: (!) 167/82 (!) 157/92  Pulse: (!) 102 85   Physical Exam Constitutional:      General: She is not in acute distress.    Appearance: Normal appearance.  Genitourinary:     Bladder and urethral meatus normal.     No lesions in the vagina.     Right Labia: No rash, tenderness, lesions, skin changes or Bartholin's cyst.    Left Labia: No tenderness, lesions, skin changes, Bartholin's cyst or rash.    No vaginal discharge, erythema, tenderness, bleeding, ulceration or granulation tissue.     Anterior and posterior vaginal prolapse present.    Severe vaginal atrophy present.     Right Adnexa: not tender, not full and no mass present.    Left Adnexa: not tender, not full and no mass present.    Cervix is absent.     Uterus is absent.     Urethral meatus caruncle not present.    Urethral prolapse (1 x 2cm) and discharge (blood tinged) present.     No urethral tenderness, mass, hypermobility or stress urinary incontinence with cough stress test  present.     Bladder is not tender, urgency on palpation not present and masses not present.      Pelvic Floor: Levator muscle strength is 3/5.    Levator ani not tender, obturator internus not tender, no asymmetrical contractions present and no pelvic spasms present.    Symmetrical pelvic sensation, anal wink present and BC reflex present. Cardiovascular:     Rate and Rhythm: Normal rate.  Pulmonary:     Effort: Pulmonary effort is normal. No respiratory distress.  Abdominal:     General: There is no distension.     Palpations: Abdomen is soft. There is no mass.     Tenderness: There is no abdominal tenderness.     Hernia: No hernia is present.   Neurological:     Mental Status: She is alert.  Vitals reviewed. Exam conducted with a chaperone present.      Media Information   Document Information  Photos    05/14/2024 11:09  Attached To:  Office Visit on 05/14/24 with Guadlupe Lianne DASEN, MD  Source Information  Guadlupe Lianne DASEN, MD  [facility]    POP-Q:   POP-Q  -1                                            Aa   -1                                           Ba  -7                                              C   2  Gh  5                                            Pb  8                                            tvl   -2                                            Ap  -2                                            Bp                                                 D     Post-Void Residual (PVR) by Bladder Scan: In order to evaluate bladder emptying, we discussed obtaining a postvoid residual and patient agreed to this procedure.  Procedure: The ultrasound unit was placed on the patient's abdomen in the suprapubic region after the patient had voided.    Post Void Residual - 05/14/24 1034       Post Void Residual   Post Void Residual 21 mL         Straight Catheterization Procedure for PVR: After verbal  consent was obtained from the patient for catheterization to assess bladder emptying and residual volume the urethra and surrounding tissues were prepped with betadine and an in and out catheterization was performed.  PVR was 50mL.  Urine appeared clear yellow. The patient tolerated the procedure well.   Laboratory Results: Lab Results  Component Value Date   COLORU yellow 05/14/2024   CLARITYU clear 05/14/2024   GLUCOSEUR negative 05/14/2024   BILIRUBINUR negative 05/14/2024   SPECGRAV 1.010 05/14/2024   RBCUR negative 05/14/2024   PHUR 6.0 05/14/2024   PROTEINUR NEGATIVE 12/31/2007   UROBILINOGEN 0.2 05/14/2024   LEUKOCYTESUR Negative 05/14/2024    Lab Results  Component Value Date   CREATININE 1.00 (H) 07/09/2016   CREATININE 1.12 (H) 07/09/2015   CREATININE 1.2 09/25/2012    Lab Results  Component Value Date   HGBA1C  01/05/2008    6.0 (NOTE)   The ADA recommends the following therapeutic goals for glycemic   control related to Hgb A1C measurement:   Goal of Therapy:   < 7.0% Hgb A1C   Action Suggested:  > 8.0% Hgb A1C   Ref:  Diabetes Care, 22, Suppl. 1, 1999    Lab Results  Component Value Date   HGB 12.8 05/05/2024     ASSESSMENT AND PLAN Ms. Cadogan is a 78 y.o. with:  1. Atrophic vaginitis   2. Constipation, unspecified constipation type   3. Prolapse urethral mucosa   4. Pelvic organ prolapse quantification stage 2 cystocele   5. Nocturia     Atrophic vaginitis Assessment & Plan: - For symptomatic vaginal atrophy options include  lubrication with a water-based lubricant, personal hygiene measures and barrier protection against wetness, and estrogen replacement in the form of vaginal cream, vaginal tablets, or a time-released vaginal ring.   - Rx to continue low dose vaginal estrogen - discussed proper vulvar care, warm compression, avoid pad use, cotton only underwear and barrier ointment if needed  - encouraged Vit E suppository, moisturizer with  Replens/Revaree   Orders: -     Estrogens  Conjugated; Place 0.5g nightly for two weeks then twice a week after  Constipation, unspecified constipation type Assessment & Plan: - started miralax 2 capful and fiber supplementation daily - For constipation, we reviewed the importance of a better bowel regimen.  We also discussed the importance of avoiding chronic straining, as it can exacerbate her pelvic floor symptoms; we discussed treating constipation and straining prior to surgery, as postoperative straining can lead to damage to the repair and recurrence of symptoms. We discussed initiating therapy with increasing fluid intake, fiber supplementation, stool softeners, and laxatives such as miralax.  - discussed association with pelvic floor disorders - encouraged squatting position for defecation and avoid straining   Prolapse urethral mucosa Assessment & Plan: - POCT UA + leuk/heme, catheterized urine negative - PVR 50mL - denies dysuria, incomplete bladder emptying or pain - denies h/o tobacco use - discussed signs and symptoms that warrant immediate evaluation - patient desires to proceed with surgical intervention, discussed need for foley catheterization.  Discussed low risk of malignancy that may require additional treatment. - continue low dose vaginal estrogen - encouraged optimization of stool consistency to minimize straining  Orders: -     POCT URINALYSIS DIP (CLINITEK) -     Estrogens  Conjugated; Place 0.5g nightly for two weeks then twice a week after -     Ambulatory Referral For Surgery Scheduling -     POCT URINALYSIS DIP (CLINITEK)  Pelvic organ prolapse quantification stage 2 cystocele Assessment & Plan: - asympatomatic - For treatment of pelvic organ prolapse, we discussed options for management including expectant management, conservative management, and surgical management, such as Kegels, a pessary, pelvic floor physical therapy, and specific surgical  procedures. - We discussed two options for prolapse repair:  - aginal repair without mesh - Pros - safer, no mesh complications - Cons - not as strong as mesh repair, higher risk of recurrence   Nocturia Assessment & Plan: - avoid fluid intake 3 hours before bedtime - due to snoring, encourage sleep study to r/o sleep apnea    Plan for surgery: Exam under anesthesia, excision of urethral prolapse, anterior/posterior repair, cystourethroscopy  - We reviewed the patient's specific anatomic and functional findings, with the assistance of diagrams, and together finalized the above procedure. The planned surgical procedures were discussed along with the surgical risks outlined below, which were also provided on a detailed handout. Additional treatment options including expectant management, conservative management, medical management were discussed where appropriate.  We reviewed the benefits and risks of each treatment option.   General Surgical Risks: For all procedures, there are risks of bleeding, infection, damage to surrounding organs including but not limited to bowel, bladder, blood vessels, ureters and nerves, and need for further surgery if an injury were to occur. These risks are all low with minimally invasive surgery.   There are risks of numbness and weakness at any body site or buttock/rectal pain.  It is possible that baseline pain can be worsened by surgery, either with or without mesh. If surgery is vaginal, there is also  a low risk of possible conversion to laparoscopy or open abdominal incision where indicated. Very rare risks include blood transfusion, blood clot, heart attack, pneumonia, or death.   There is also a risk of short-term postoperative urinary retention with need to use a catheter. About half of patients need to go home from surgery with a catheter, which is then later removed in the office. The risk of long-term need for a catheter is very low. There is also a  risk of worsening of overactive bladder.   Prolapse (with or without mesh): Risk factors for surgical failure  include things that put pressure on your pelvis and the surgical repair, including obesity, chronic cough, and heavy lifting or straining (including lifting children or adults, straining on the toilet, or lifting heavy objects such as furniture or anything weighing >25 lbs. Risks of recurrence is 20-30% with vaginal native tissue repair and a less than 10% with sacrocolpopexy with mesh.    - For preop Visit:  She is required to have a visit within 30 days of her surgery.   Today we reviewed pre-operative preparation, peri-operative expectations, and post-operative instructions/recovery.  She was provided with instructional handouts. She understands not to take aspirin (>81mg ) or NSAIDs 7 days prior to surgery. Prescriptions will be provided for: Oxycodone 5mg , Ibuprofen 600mg , Tylenol  500mg , Miralax. These prescriptions will be sent prior to surgery.  - Medical clearance: required Letter sent to cardiology requesting risk stratification and medical optimization. - Anticoagulant use: Yes - Medicaid Hysterectomy form: No - Accepts blood transfusion: Yes - Expected length of stay: outpatient  Request sent for surgery scheduling.   Time spent: I spent 69 minutes dedicated to the care of this patient on the date of this encounter to include pre-visit review of records, face-to-face time with the patient discussing urethral prolapse, stage II pelvic organ prolapse, vaginal atrophy, constipation, and post visit documentation and ordering medication/ testing.   Lianne ONEIDA Gillis, MD

## 2024-05-14 NOTE — Assessment & Plan Note (Addendum)
-   avoid fluid intake 3 hours before bedtime - due to snoring, encourage sleep study to r/o sleep apnea

## 2024-05-14 NOTE — Assessment & Plan Note (Addendum)
-   POCT UA + leuk/heme, catheterized urine negative - PVR 50mL - denies dysuria, incomplete bladder emptying or pain - denies h/o tobacco use - discussed signs and symptoms that warrant immediate evaluation - patient desires to proceed with surgical intervention, discussed need for foley catheterization.  Discussed low risk of malignancy that may require additional treatment. - continue low dose vaginal estrogen - encouraged optimization of stool consistency to minimize straining

## 2024-05-14 NOTE — Assessment & Plan Note (Signed)
-   For symptomatic vaginal atrophy options include lubrication with a water-based lubricant, personal hygiene measures and barrier protection against wetness, and estrogen replacement in the form of vaginal cream, vaginal tablets, or a time-released vaginal ring.   - Rx to continue low dose vaginal estrogen - discussed proper vulvar care, warm compression, avoid pad use, cotton only underwear and barrier ointment if needed  - encouraged Vit E suppository, moisturizer with Replens/Revaree

## 2024-05-15 ENCOUNTER — Ambulatory Visit: Admitting: Obstetrics

## 2024-05-16 ENCOUNTER — Encounter: Payer: Self-pay | Admitting: Obstetrics

## 2024-05-20 ENCOUNTER — Encounter: Payer: Self-pay | Admitting: Obstetrics

## 2024-05-23 ENCOUNTER — Encounter: Payer: Self-pay | Admitting: Obstetrics

## 2024-05-24 ENCOUNTER — Encounter: Payer: Self-pay | Admitting: Obstetrics

## 2024-05-28 NOTE — Telephone Encounter (Signed)
 Patient came into the office wanting to know if her surgery is booked for November 13 th at 7:30 am. Patient husband has dementia and is having her daughter from [place] come down to help with care. Patient is very anxious and would like clarification if surgery will be booked or if she would need to wait until the first of the year.  Please advise

## 2024-05-29 ENCOUNTER — Encounter: Payer: Self-pay | Admitting: Obstetrics

## 2024-05-31 ENCOUNTER — Encounter: Payer: Self-pay | Admitting: Obstetrics

## 2024-06-05 DIAGNOSIS — N183 Chronic kidney disease, stage 3 unspecified: Secondary | ICD-10-CM | POA: Diagnosis not present

## 2024-06-05 DIAGNOSIS — I10 Essential (primary) hypertension: Secondary | ICD-10-CM | POA: Diagnosis not present

## 2024-06-05 DIAGNOSIS — I2510 Atherosclerotic heart disease of native coronary artery without angina pectoris: Secondary | ICD-10-CM | POA: Diagnosis not present

## 2024-06-05 DIAGNOSIS — E7800 Pure hypercholesterolemia, unspecified: Secondary | ICD-10-CM | POA: Diagnosis not present

## 2024-06-11 DIAGNOSIS — I2510 Atherosclerotic heart disease of native coronary artery without angina pectoris: Secondary | ICD-10-CM | POA: Diagnosis not present

## 2024-06-11 DIAGNOSIS — E7800 Pure hypercholesterolemia, unspecified: Secondary | ICD-10-CM | POA: Diagnosis not present

## 2024-06-11 DIAGNOSIS — Z0000 Encounter for general adult medical examination without abnormal findings: Secondary | ICD-10-CM | POA: Diagnosis not present

## 2024-06-11 DIAGNOSIS — I10 Essential (primary) hypertension: Secondary | ICD-10-CM | POA: Diagnosis not present

## 2024-06-11 DIAGNOSIS — E119 Type 2 diabetes mellitus without complications: Secondary | ICD-10-CM | POA: Diagnosis not present

## 2024-06-11 DIAGNOSIS — N368 Other specified disorders of urethra: Secondary | ICD-10-CM | POA: Diagnosis not present

## 2024-06-11 NOTE — Telephone Encounter (Signed)
 Pt is scheduled Dec 3rd 830.  Pt knows if I have a cancellation then I will offer her the date.

## 2024-06-11 NOTE — Telephone Encounter (Signed)
 Surgery is scheduled and patient has been notified

## 2024-06-19 NOTE — Telephone Encounter (Signed)
 Pt knows that surgery will be checked 30 days before surgery date.  This was communicated at time of scheduling surgery. KD

## 2024-06-25 ENCOUNTER — Ambulatory Visit: Admitting: Obstetrics

## 2024-06-25 ENCOUNTER — Encounter: Payer: Self-pay | Admitting: Obstetrics

## 2024-06-25 VITALS — BP 142/81 | HR 90

## 2024-06-25 DIAGNOSIS — N952 Postmenopausal atrophic vaginitis: Secondary | ICD-10-CM | POA: Diagnosis not present

## 2024-06-25 DIAGNOSIS — N8110 Cystocele, unspecified: Secondary | ICD-10-CM | POA: Diagnosis not present

## 2024-06-25 DIAGNOSIS — K5900 Constipation, unspecified: Secondary | ICD-10-CM

## 2024-06-25 DIAGNOSIS — N368 Other specified disorders of urethra: Secondary | ICD-10-CM | POA: Diagnosis not present

## 2024-06-25 NOTE — Patient Instructions (Signed)
 Continue vaginal estrogen 1g twice a week.   Please return if you experience change in urinary or vaginal symptoms.

## 2024-06-25 NOTE — Assessment & Plan Note (Signed)
-   asympatomatic - For treatment of pelvic organ prolapse, we discussed options for management including expectant management, conservative management, and surgical management, such as Kegels, a pessary, pelvic floor physical therapy, and specific surgical procedures. - We reviewed options for prolapse repair, vaginal repair without mesh - Pros - safer, no mesh complications - Cons - not as strong as mesh repair, higher risk of recurrence - discussed risk of surgery vs. benefit of clinical improvement with prolapse repair, patient desires to consider only proceeding with urethral prolapse excision at this time pending repeat exam 07/2024

## 2024-06-25 NOTE — Assessment & Plan Note (Signed)
-   For symptomatic vaginal atrophy options include lubrication with a water-based lubricant, personal hygiene measures and barrier protection against wetness, and estrogen replacement in the form of vaginal cream, vaginal tablets, or a time-released vaginal ring.   - Rx to continue low dose vaginal estrogen 1g 2x/week - continue proper vulvar care, warm compression, avoid pad use, cotton only underwear and barrier ointment - encouraged Vit E suppository, moisturizer with Replens/Revaree

## 2024-06-25 NOTE — Assessment & Plan Note (Signed)
-   continues miralax 1 capful every 2 days and fiber supplementation daily, encouraged to continue titration to minimize straining  - For constipation, we reviewed the importance of a better bowel regimen.  We also discussed the importance of avoiding chronic straining, as it can exacerbate her pelvic floor symptoms; we discussed treating constipation and straining prior to surgery, as postoperative straining can lead to damage to the repair and recurrence of symptoms. We discussed initiating therapy with increasing fluid intake, fiber supplementation, stool softeners, and laxatives such as miralax.  - discussed association with pelvic floor disorders and risk of urethral prolapse recurrence if she resumes straining - encouraged squatting position for defecation and avoid straining

## 2024-06-25 NOTE — Progress Notes (Signed)
 [facility] Pre-Operative H&P  Subjective Chief Complaint: [name] presents for a preoperative encounter.   History of Present Illness: [name] is a 78 y.o. female who presents for preoperative visit.  She is scheduled to undergo excision of urethral prolapse and cystoscopy on 08/08/24 for urethral prolapse.     Bleeding and discharge resolved after 2 weeks of vaginal estrogen.  Continues vaginal estrogen 2 times a week.  Denies incomplete emptying or blood in urine, reports reduction in size of prolapse. Denies pain, discomfort.  Uses miralax 1 capful every 2 days with resolution of straining for constipation with BM 1-2x/day  Previously BM 3-4 time(s) per week   Desires to proceed with surgery, denies issues with anesthesia and daughter has been arranged to   Past Medical History:  Diagnosis Date   Chest pain    Coronary artery disease    Headache, acute    Hyperlipidemia    Hypertension    MI, old    APICAL INFERIOR     Past Surgical History:  Procedure Laterality Date   ABDOMINAL HYSTERECTOMY  1981   AUB with anemia   AUGMENTATION MAMMAPLASTY Bilateral    BILATERAL SALPINGOOPHORECTOMY  1985   ovarian cysts   BREAST BIOPSY Right 2017   CARDIAC CATHETERIZATION  08/12/2006   SINGLE VESSEL CAD    is allergic to lisinopril and plavix [clopidogrel bisulfate].   Family History  Problem Relation Age of Onset   Alzheimer's disease Mother    Heart murmur Sister    Healthy Sister    Cancer - Lung Other    Breast cancer Neg Hx    Bladder Cancer Neg Hx    Renal cancer Neg Hx    Uterine cancer Neg Hx     Social History   Tobacco Use   Smoking status: Never   Smokeless tobacco: Never  Vaping Use   Vaping status: Never Used  Substance Use Topics   Alcohol use: No   Drug use: No     Review of Systems was negative for a full 10 system review except as noted in the History of Present Illness.   Current Outpatient Medications:     amLODipine  (NORVASC ) 2.5 MG tablet, Take 2.5 mg by mouth daily., Disp: , Rfl:    aspirin 81 MG tablet, Take 81 mg by mouth daily., Disp: , Rfl:    atorvastatin (LIPITOR) 20 MG tablet, Take 1 tablet by mouth daily., Disp: , Rfl:    azelastine (ASTELIN) 0.1 % nasal spray, Place 1 spray into both nostrils 2 (two) times daily., Disp: , Rfl:    citalopram (CELEXA) 20 MG tablet, Take 20 mg by mouth daily., Disp: , Rfl:    estradiol  (ESTRACE ) 0.1 MG/GM vaginal cream, Place 0.5 g vaginally 2 (two) times a week. Place 0.5g nightly for two weeks then twice a week after, Disp: 42.5 g, Rfl: 11   losartan  (COZAAR ) 50 MG tablet, Take 50 mg by mouth daily., Disp: , Rfl:    nitroGLYCERIN  (NITROSTAT ) 0.4 MG SL tablet, 1 tablet Sublingual every 5 minutes for chest pain as needed, x 3, Disp: , Rfl:    polyethylene glycol powder (GLYCOLAX/MIRALAX) 17 GM/SCOOP powder, Take 17 g by mouth daily. Dissolve 1 capful (17g) in 4-8 ounces of liquid and take by mouth daily., Disp: , Rfl:    valACYclovir (VALTREX) 500 MG tablet, Take 500 mg by mouth daily as needed., Disp: , Rfl:    Objective Vitals:   06/25/24 1542  BP: (!) 142/81  Pulse: 90    Gen: NAD  Physical Exam Constitutional:      General: She is not in acute distress.    Appearance: Normal appearance.  Genitourinary:     Genitourinary Comments: Urethral prolapse pale without ulceration, pain, discharge or bleeding, approximately 7-78mm in size. Resolution of petechiae and purple appearance on prior exam      Right Labia: No rash, tenderness, lesions, skin changes or Bartholin's cyst.    Left Labia: No tenderness, lesions, skin changes, Bartholin's cyst or rash.       No vaginal discharge or bleeding.     Anterior vaginal prolapse present.    Moderate vaginal atrophy present.    Urethral prolapse present.     No urethral tenderness, discharge or stress urinary incontinence with cough stress test present.  Cardiovascular:     Rate and Rhythm: Normal  rate.  Pulmonary:     Effort: Pulmonary effort is normal. No respiratory distress.  Neurological:     Mental Status: She is alert.  Vitals reviewed. Exam conducted with a chaperone present.      Post Void Residual - 06/25/24 1547       Post Void Residual   Post Void Residual 14 mL          Assessment/ Plan  Assessment: The patient is a 78 y.o. year old scheduled to undergo excision of urethral prolapse and cystourethroscopy.   Prolapse urethral mucosa Assessment & Plan: - 05/14/24 POCT UA + leuk/heme, catheterized urine negative. PVR 50mL, repeat 14mL today - denies dysuria, incomplete bladder emptying or pain - denies h/o tobacco use - discussed signs and symptoms that warrant immediate evaluation - patient desires to proceed with surgical intervention despite asymptomatic prolapse, discussed need for foley catheterization postoperatively.  Discussed low risk of malignancy that may require additional treatment and risk of recurrence.  - symptoms of bleeding resolved after 2 weeks of low dose vaginal estrogen with reduction in size - encouraged to continue optimization of stool consistency to minimize straining - through joint decision making, pt desires to reassess symptoms in 4 weeks and proceed if persistent or worsening bulge symptoms despite lack of bother.   Atrophic vaginitis Assessment & Plan: - For symptomatic vaginal atrophy options include lubrication with a water-based lubricant, personal hygiene measures and barrier protection against wetness, and estrogen replacement in the form of vaginal cream, vaginal tablets, or a time-released vaginal ring.   - Rx to continue low dose vaginal estrogen 1g 2x/week - continue proper vulvar care, warm compression, avoid pad use, cotton only underwear and barrier ointment - encouraged Vit E suppository, moisturizer with Replens/Revaree    Constipation, unspecified constipation type Assessment & Plan: - continues miralax 1  capful every 2 days and fiber supplementation daily, encouraged to continue titration to minimize straining  - For constipation, we reviewed the importance of a better bowel regimen.  We also discussed the importance of avoiding chronic straining, as it can exacerbate her pelvic floor symptoms; we discussed treating constipation and straining prior to surgery, as postoperative straining can lead to damage to the repair and recurrence of symptoms. We discussed initiating therapy with increasing fluid intake, fiber supplementation, stool softeners, and laxatives such as miralax.  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 - We reviewed options for prolapse repair, vaginal repair without mesh - Pros - safer, no mesh complications - Cons - not as strong as mesh repair, higher risk of recurrence - discussed risk of surgery vs. benefit of clinical improvement with prolapse repair, patient desires to consider only proceeding with urethral prolapse excision at this time pending repeat exam 07/2024   Plan: General Surgical Consent: The patient has previously been counseled on alternative treatments, and the decision by the patient and provider was to proceed with the procedure listed above.  For all procedures, there are risks of bleeding, infection, damage to surrounding organs including but not limited to bowel, bladder, blood vessels, ureters and nerves, and need for further surgery if an injury were to occur. These risks are  all low with minimally invasive surgery.   There are risks of numbness and weakness at any body site or buttock/rectal pain.  It is possible that baseline pain can be worsened by surgery, either with or without mesh. If surgery is vaginal, there is also a low risk of possible conversion to laparoscopy or open abdominal incision where indicated. Very rare risks include blood transfusion, blood clot, heart attack, pneumonia, or death.   There is also a risk of short-term postoperative urinary retention with need to use a catheter. About half of patients need to go home from surgery with a catheter, which is then later removed in the office. The risk of long-term need for a catheter is very low. There is also a risk of worsening of overactive bladder.   Pre-operative instructions:  She was instructed to not take Aspirin/NSAIDs x 7days prior to surgery. She may continue her 81mg  ASA. Antibiotic prophylaxis will be ordered as indicated.  Catheter use: Patient will go home with foley if needed after post-operative voiding trial.  Post-operative instructions:  She was provided with specific post-operative instructions, including precautions and signs/symptoms for which we would recommend contacting us , in addition to daytime and after-hours contact phone numbers. This was provided on a handout.   Post-operative medications: Prescriptions for motrin, tylenol , miralax, and oxycodone will be sent to her pharmacy if she desires to proceed with surgery. Discussed using ibuprofen and tylenol  on a schedule to limit use of narcotics.   Laboratory testing:  We will check labs: pending anesthesia recommendations.   Preoperative clearance:  She does require surgical clearance.    Post-operative follow-up:  A post-operative appointment will be made for 6 weeks from the date of surgery. If she needs a post-operative nurse visit for a voiding trial, that will be set up after she leaves the hospital.    Patient will  call the clinic or use MyChart should anything change or any new issues arise.   Lianne ONEIDA Gillis, MD

## 2024-06-25 NOTE — Assessment & Plan Note (Signed)
-   05/14/24 POCT UA + leuk/heme, catheterized urine negative. PVR 50mL, repeat 14mL today - denies dysuria, incomplete bladder emptying or pain - denies h/o tobacco use - discussed signs and symptoms that warrant immediate evaluation - patient desires to proceed with surgical intervention despite asymptomatic prolapse, discussed need for foley catheterization postoperatively.  Discussed low risk of malignancy that may require additional treatment and risk of recurrence.  - symptoms of bleeding resolved after 2 weeks of low dose vaginal estrogen with reduction in size - encouraged to continue optimization of stool consistency to minimize straining - through joint decision making, pt desires to reassess symptoms in 4 weeks and proceed if persistent or worsening bulge symptoms despite lack of bother.

## 2024-07-10 ENCOUNTER — Telehealth: Payer: Self-pay | Admitting: Obstetrics

## 2024-07-10 NOTE — Telephone Encounter (Signed)
 Patient called in today stated she does not want to pursue surgery at this time.  She asked that we cancel all future appointments and will call back to scheduled a follow up to touch base in about February.

## 2024-07-17 ENCOUNTER — Encounter: Admitting: Obstetrics and Gynecology

## 2024-08-08 ENCOUNTER — Ambulatory Visit (HOSPITAL_COMMUNITY): Admit: 2024-08-08 | Admitting: Obstetrics

## 2024-08-08 SURGERY — EXCISION, PROLAPSE, URETHRA
Anesthesia: General

## 2024-08-22 ENCOUNTER — Other Ambulatory Visit: Payer: Self-pay | Admitting: Family Medicine

## 2024-08-22 DIAGNOSIS — Z1231 Encounter for screening mammogram for malignant neoplasm of breast: Secondary | ICD-10-CM

## 2024-09-14 ENCOUNTER — Encounter: Admitting: Obstetrics

## 2024-09-17 ENCOUNTER — Encounter: Payer: Self-pay | Admitting: *Deleted

## 2024-10-08 ENCOUNTER — Ambulatory Visit

## 2024-10-30 ENCOUNTER — Ambulatory Visit
